# Patient Record
Sex: Female | Born: 1940 | Race: White | Hispanic: No | Marital: Single | Smoking: Former smoker
Health system: Southern US, Community
[De-identification: ages and names within clinical notes are randomized; demographics above are authoritative.]

## PROBLEM LIST (undated history)

## (undated) DIAGNOSIS — Z860100 Personal history of colon polyps, unspecified: Secondary | ICD-10-CM

## (undated) DIAGNOSIS — Z85828 Personal history of other malignant neoplasm of skin: Principal | ICD-10-CM

## (undated) DIAGNOSIS — Z8619 Personal history of other infectious and parasitic diseases: Secondary | ICD-10-CM

## (undated) DIAGNOSIS — C4492 Squamous cell carcinoma of skin, unspecified: Secondary | ICD-10-CM

## (undated) DIAGNOSIS — I6529 Occlusion and stenosis of unspecified carotid artery: Secondary | ICD-10-CM

## (undated) DIAGNOSIS — N189 Chronic kidney disease, unspecified: Secondary | ICD-10-CM

## (undated) DIAGNOSIS — E559 Vitamin D deficiency, unspecified: Secondary | ICD-10-CM

## (undated) DIAGNOSIS — H8110 Benign paroxysmal vertigo, unspecified ear: Secondary | ICD-10-CM

## (undated) DIAGNOSIS — R42 Dizziness and giddiness: Secondary | ICD-10-CM

## (undated) DIAGNOSIS — K219 Gastro-esophageal reflux disease without esophagitis: Secondary | ICD-10-CM

## (undated) DIAGNOSIS — Z8719 Personal history of other diseases of the digestive system: Secondary | ICD-10-CM

## (undated) DIAGNOSIS — K589 Irritable bowel syndrome without diarrhea: Secondary | ICD-10-CM

## (undated) DIAGNOSIS — R05 Cough: Secondary | ICD-10-CM

## (undated) DIAGNOSIS — L723 Sebaceous cyst: Secondary | ICD-10-CM

## (undated) DIAGNOSIS — E785 Hyperlipidemia, unspecified: Secondary | ICD-10-CM

## (undated) HISTORY — DX: Sebaceous cyst: L72.3

## (undated) HISTORY — DX: Personal history of colon polyps, unspecified: Z86.0100

## (undated) HISTORY — PX: COLONOSCOPY: SHX174

## (undated) HISTORY — DX: Squamous cell carcinoma of skin, unspecified: C44.92

## (undated) HISTORY — DX: Personal history of other malignant neoplasm of skin: Z85.828

## (undated) HISTORY — DX: Personal history of other infectious and parasitic diseases: Z86.19

## (undated) HISTORY — DX: Cough: R05

## (undated) HISTORY — PX: CYST EXCISION: SHX5701

## (undated) HISTORY — DX: Personal history of colonic polyps: Z86.010

## (undated) HISTORY — DX: Irritable bowel syndrome, unspecified: K58.9

## (undated) HISTORY — DX: Benign paroxysmal vertigo, unspecified ear: H81.10

---

## 1988-12-31 HISTORY — PX: OTHER SURGICAL HISTORY: SHX169

## 1998-11-02 HISTORY — PX: BREAST SURGERY: SHX581

## 2002-11-02 DIAGNOSIS — R053 Chronic cough: Secondary | ICD-10-CM

## 2002-11-02 HISTORY — DX: Chronic cough: R05.3

## 2012-03-24 LAB — HM DEXA SCAN

## 2013-02-06 DIAGNOSIS — E559 Vitamin D deficiency, unspecified: Secondary | ICD-10-CM | POA: Insufficient documentation

## 2013-02-06 DIAGNOSIS — Z800 Family history of malignant neoplasm of digestive organs: Secondary | ICD-10-CM | POA: Insufficient documentation

## 2013-02-07 DIAGNOSIS — R351 Nocturia: Secondary | ICD-10-CM | POA: Insufficient documentation

## 2013-02-07 DIAGNOSIS — K589 Irritable bowel syndrome without diarrhea: Secondary | ICD-10-CM | POA: Insufficient documentation

## 2013-02-07 DIAGNOSIS — D126 Benign neoplasm of colon, unspecified: Secondary | ICD-10-CM | POA: Insufficient documentation

## 2013-02-16 DIAGNOSIS — R7309 Other abnormal glucose: Secondary | ICD-10-CM | POA: Insufficient documentation

## 2014-02-13 ENCOUNTER — Ambulatory Visit: Payer: Self-pay | Admitting: Adult Health

## 2014-03-07 ENCOUNTER — Ambulatory Visit (INDEPENDENT_AMBULATORY_CARE_PROVIDER_SITE_OTHER): Payer: Medicare Other | Admitting: Adult Health

## 2014-03-07 ENCOUNTER — Encounter: Payer: Self-pay | Admitting: Adult Health

## 2014-03-07 VITALS — BP 128/72 | HR 78 | Temp 98.2°F | Resp 14 | Ht 63.0 in | Wt 125.0 lb

## 2014-03-07 DIAGNOSIS — M21611 Bunion of right foot: Secondary | ICD-10-CM

## 2014-03-07 DIAGNOSIS — M21619 Bunion of unspecified foot: Secondary | ICD-10-CM

## 2014-03-07 DIAGNOSIS — M810 Age-related osteoporosis without current pathological fracture: Secondary | ICD-10-CM

## 2014-03-07 DIAGNOSIS — Z8551 Personal history of malignant neoplasm of bladder: Secondary | ICD-10-CM | POA: Insufficient documentation

## 2014-03-07 MED ORDER — CLOTRIMAZOLE-BETAMETHASONE 1-0.05 % EX CREA: 1.0000 "application " | TOPICAL_CREAM | Freq: Two times a day (BID) | CUTANEOUS | Status: DC

## 2014-03-07 NOTE — Patient Instructions (Signed)
   Thank you for choosing [place] [facility] for your health care needs.  I am referring you to Dr. Jacqlyn Larsen - Urology  I am referring you to Dr. Vickki Muff for podiatry  I am also referring you to have a bone density scan.  We will call you with these appointments.  Please schedule your Medicare Wellness Exam.

## 2014-03-07 NOTE — Progress Notes (Signed)
Patient ID: Kathleen Fitzgerald, female   DOB: [birth year], 73 y.o.   MRN: [id number]    Subjective:    Patient ID: Kathleen Fitzgerald, female    DOB: 10-13-1941, 73 y.o.   MRN: [id number]  HPI  Pt is a very pleasant 73 y/o female who presents to clinic to establish care. She moved from [place] to [place] about 2 years ago. She lived in [place] ~ 1 year and recently moved to [place]. She has brought some of her medical records.  Last Medicare Wellness Exam - 02/06/13. She will schedule her physical at her earliest convenience.  Dexa Scan 2013 showing osteoporosis. On Fosamax. Will schedule to have follow up Dexa.  Mammogram - 01/2013 - Normal  Pt has hx of carcinoma of the bladder. She was followed by Urologist in [place]. She was diagnosed in 2004. She will need to establish with a urologist locally.  She has a painful bunion on the right great toe. She has had bunion surgery on this foot. She would like a referral to podiatry.    Past Medical History  Diagnosis Date  . Personal history of colonic polyps     Colonoscopy 04/12/13 - Repeat 5 years - polys  . History of chicken pox     childhood  . Sebaceous cyst 1975 and 2006    Scalp  . BPPV (benign paroxysmal positional vertigo)     2 episodes  . IBS (irritable bowel syndrome)   . Chronic cough 2004    Has been evaluated with no etiology     Past Surgical History  Procedure Laterality Date  . Breast surgery Left 2000    biopsy - benign  . Bunion repair Right 12/1988     Family History  Problem Relation Age of Onset  . Breast cancer Mother   . Hypertension Mother   . Colon cancer Father   . Alcohol abuse Father   . Stroke Father   . Breast cancer Sister   . COPD Brother      History   Social History  . Marital Status: Single    Spouse Name: N/A    Number of Children: 0  . Years of Education: 13   Occupational History  . Secretarial Work     Retired   Social History Main Topics  . Smoking status: Former  Smoker -- 2.00 packs/day for 33 years    Quit date: 11/03/1991  . Smokeless tobacco: Never Used  . Alcohol Use: Yes     Comment: wine - rare  . Drug Use: No  . Sexual Activity: Not on file   Other Topics Concern  . Not on file   Social History Narrative   Delsy grew up in [place]. She was living in [place] for 1 year. She recently moved to the [place] area. She lives alone. Never married. No children. She has two cats ([place]). She enjoys gardening.       Exercise - not at present   Caffeine - 1 cup of coffee daily   Diet - follows healthy diet - fruits, vegetables, lean protein, Almond milk, water     Review of Systems  Constitutional: Negative.   HENT: Negative.   Eyes: Negative.   Respiratory: Negative.   Cardiovascular: Negative.   Gastrointestinal: Negative.   Endocrine: Negative.   Genitourinary: Negative.  Hematuria: right bunion pain.  Musculoskeletal: Positive for arthralgias.  Skin: Negative.   Allergic/Immunologic: Negative.   Neurological: Negative.  Hematological: Negative.   Psychiatric/Behavioral: Negative.        Objective:  BP 128/72  Pulse 78  Temp(Src) 98.2 F (36.8 C) (Oral)  Resp 14  Ht 5\' 3"  (1.6 m)  Wt 125 lb (56.7 kg)  BMI 22.15 kg/m2  SpO2 97%   Physical Exam  Constitutional: She is oriented to person, place, and time. No distress.  HENT:  Head: Normocephalic and atraumatic.  Eyes: Conjunctivae and EOM are normal.  Neck: Normal range of motion. Neck supple.  Cardiovascular: Normal rate, regular rhythm, normal heart sounds and intact distal pulses.  Exam reveals no gallop and no friction rub.   No murmur heard. Pulmonary/Chest: Effort normal and breath sounds normal. No respiratory distress. She has no wheezes. She has no rales.  Musculoskeletal: Normal range of motion.  Right great toe bunion  Neurological: She is alert and oriented to person, place, and time. She has normal reflexes. Coordination normal.  Skin: Skin is  warm and dry.  Psychiatric: She has a normal mood and affect. Her behavior is normal. Judgment and thought content normal.       Assessment & Plan:   1. Bunion of right foot Painful bunion. Refer to Dr. Vickki Muff - Ambulatory referral to Podiatry  2. Osteoporosis Ordered Dexa scan. Pt on fosamax weekly. Continue to follow. - HM DEXA SCAN  3. H/O carcinoma of bladder Diagnosed in 2004 and followed by urologist in [place]. I am referring her to Dr. Jacqlyn Larsen for follow up. - Ambulatory referral to Urology

## 2014-03-07 NOTE — Progress Notes (Signed)
Pre visit review using our clinic review tool, if applicable. No additional management support is needed unless otherwise documented below in the visit note. 

## 2014-03-09 ENCOUNTER — Telehealth: Payer: Self-pay | Admitting: Adult Health

## 2014-03-09 NOTE — Telephone Encounter (Signed)
Pt checking status for Urology referral.

## 2014-03-09 NOTE — Telephone Encounter (Signed)
Do you have a urology reerral for Kathleen Fitzgerald?

## 2014-03-12 ENCOUNTER — Telehealth: Payer: Self-pay | Admitting: *Deleted

## 2014-03-12 NOTE — Telephone Encounter (Signed)
Pt called checking on referral

## 2014-03-12 NOTE — Telephone Encounter (Signed)
PA request form for the Clotrimazole was placed in raquel box

## 2014-03-16 ENCOUNTER — Encounter: Payer: Self-pay | Admitting: Adult Health

## 2014-03-16 ENCOUNTER — Telehealth: Payer: Self-pay | Admitting: Adult Health

## 2014-03-16 ENCOUNTER — Other Ambulatory Visit: Payer: Self-pay | Admitting: Adult Health

## 2014-03-16 DIAGNOSIS — Z13820 Encounter for screening for osteoporosis: Secondary | ICD-10-CM

## 2014-03-16 NOTE — Telephone Encounter (Signed)
Patient requesting a order for a bone density test. Please see Shannon's pervious noted related to this request.

## 2014-03-16 NOTE — Telephone Encounter (Signed)
Patient states that you were going to order her a bone density test. She is going back to see Dr. Henrene Pastor in [place] instead of Dr. Jacqlyn Larsen. She was told by there office he was moving in June and that it might be June/July before he could see her. She can do Monday afternoon and Tuesday, Thursday and Friday anytime she isn't available on Wednesday at all.

## 2014-03-16 NOTE — Telephone Encounter (Signed)
Dexa scan ordered. 

## 2014-03-16 NOTE — Telephone Encounter (Signed)
Patient state she only uses the Lotrisone cream once a day and not twice a day as noted in her chart. I have updated this in her chart per [facility].

## 2014-03-25 NOTE — Telephone Encounter (Signed)
Documented all information in referral: [name] went to Dr. Bjorn Loser office and they told her it would be in June/July. Patient has opted to go back to [facility] to see Dr.Kevin Herbert Spires on Mar 19, 2014 in the afternoon.Kathleen Fitzgerald,Kathleen Fitzgerald

## 2014-03-27 NOTE — Telephone Encounter (Signed)
Pt came into office to inform she has seen Dr. Lennette Bihari P. Henrene Pastor, [facility] [place], [place] in [place].  Pt completed record request; faxed to Mickel Baas at [phone].  Phone: [phone].

## 2014-03-29 ENCOUNTER — Ambulatory Visit (INDEPENDENT_AMBULATORY_CARE_PROVIDER_SITE_OTHER): Payer: Medicare Other

## 2014-03-29 ENCOUNTER — Encounter: Payer: Self-pay | Admitting: Podiatrist

## 2014-03-29 ENCOUNTER — Ambulatory Visit (INDEPENDENT_AMBULATORY_CARE_PROVIDER_SITE_OTHER): Payer: Medicare Other | Admitting: Podiatrist

## 2014-03-29 VITALS — BP 124/70 | HR 66 | Resp 16 | Ht 63.0 in | Wt 125.0 lb

## 2014-03-29 DIAGNOSIS — M21619 Bunion of unspecified foot: Secondary | ICD-10-CM

## 2014-03-29 NOTE — Progress Notes (Signed)
   Subjective:    Patient ID: Kathleen Fitzgerald, female    DOB: [birth year], 73 y.o.   MRN: [id number]  HPI Comments: i have bunions on both of my feet. The right one hurts and the left one does not. i had surgery on the rt bunion 25 yrs ago and they didn't fix it right. If i play tennis it will hurt and i will have to pad it. Kathleen Fitzgerald been having problems for the last 25 yrs. i will wrap the big toe on the right foot. It seems to be getting worse. i have a callus on my right foot.  Foot Pain Associated symptoms include coughing.      Review of Systems  HENT:       Sinus problems  Respiratory: Positive for cough.   Gastrointestinal: Positive for diarrhea.  All other systems reviewed and are negative.      Objective:   Physical Exam Patient is awake, alert, and oriented x 3.  In no acute distress.  Vascular status is intact with palpable pedal pulses at 2/4 DP and PT bilateral and capillary refill time within normal limits. Neurological sensation is also intact bilaterally via Semmes Weinstein monofilament at 5/5 sites. Light touch, vibratory sensation, Achilles tendon reflex is intact. Dermatological exam reveals skin color, turger and texture as normal. No open lesions present.  Musculature intact with dorsiflexion, plantarflexion, inversion, eversion.  Are large bunion prominence is noted right greater than left. Decrease in range of motion at the first metatarsophalangeal joint is present right in comparison to left. The right hallux also presses laterally on the second toe and causes discomfort in the first interspace. The left bunion subjectively does not cause much discomfort.  X-rays are taken and revealed a previous shave bunionectomy on the right however it does not appear that the alignment of the metatarsophalangeal joint was addressed. She still has a large bony prominence and some arthritic changes at the first metatarsophalangeal joint and especially the base of the proximal phalanx.  The left foot also shows a bunion however arthritic changes are less.      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 I did agree that a bunion surgery would be the best way to reduce the pain and restore function to the joint. We discussed the surgery as well as the postoperative course. Due to her activity level she would like to wait until next year to consider the surgery. She will call if she is interested in having the operation performed. We did discuss it's been performed at [facility] in [place] on outpatient basis. She was concerned about having someone to take her to the surgery being that it is not in [place].

## 2014-03-29 NOTE — Patient Instructions (Signed)
Bunionectomy A bunionectomy is surgery to remove a bunion. A bunion is an enlargement of the joint at the base of the big toe. It is made up of bone and soft tissue on the inside part of the joint. Over time, a painful lump appears on the inside of the joint. The big toe begins to point inward toward the second toe. New bone growth can occur and a bone spur may form. The pain eventually causes difficulty walking. A bunion usually results from inflammation caused by the irritation of poorly fitting shoes. It often begins later in life. A bunionectomy is performed when nonsurgical treatment no longer works. When surgery is needed, the extent of the procedure will depend on the degree of deformity of the foot. Your surgeon will discuss with you the different procedures and what will work best for you depending on your age and health. LET YOUR CAREGIVER KNOW ABOUT:   Previous problems with anesthetics or medicines used to numb the skin.  Allergies to dyes, iodine, foods, and/or latex.  Medicines taken including herbs, eye drops, prescription medicines (especially medicines used to "thin the blood"), aspirin and other over-the-counter medicines, and steroids (by mouth or as a cream).  History of bleeding or blood problems.  Possibility of pregnancy, if this applies.  History of blood clots in your legs and/or lungs .  Previous surgery.  Other important health problems. RISKS AND COMPLICATIONS   Infection.  Pain.  Nerve damage.  Possibility that the bunion will recur. BEFORE THE PROCEDURE  You should be present 60 minutes prior to your procedure or as directed.  PROCEDURE  Surgery is often done so that you can go home the same day (outpatient). It may be done in a hospital or in an outpatient surgical center. An anesthetic will be used to help you sleep during the procedure. Sometimes, a spinal anesthetic is used to make you numb below the waist. A cut (incision) is made over the swollen  area at the first joint of the big toe. The enlarged lump will be removed. If there is a need to reposition the bones of the big toe, this may require more than 1 incision. The bone itself may need to be cut. Screws and wires may be used in the repair. These can be removed at a later date. In severe cases, the entire joint may need to be removed and a joint replacement inserted. When done, the incision is closed with stitches (sutures). Skin adhesive strips may be added for reinforcement. They help hold the incision closed.  AFTER THE PROCEDURE  Compression bandages (dressings) are then wrapped around the wound. This helps to keep the foot in alignment and reduce swelling. Your foot will be monitored for bleeding and swelling. You will need to stay for a few hours in the recovery area before being discharged. This allows time for the anesthesia to wear off. You will be discharged home when you are awake, stable, and doing well. HOME CARE INSTRUCTIONS   You can expect to return to normal activities within 6 to 8 weeks after surgery. The foot is at increased risk for swelling for several months. When you can expect to bear weight on the operated foot will depend on the extent of your surgery. The milder the deformity, the less tissue is removed and the sooner the return to normal activity level. During the recovery period, a special shoe, boot, or cast may be worn to accommodate the surgical bandage and to help provide stability   to the foot.  Once you are home, an ice pack applied to the operative site may help with discomfort and keep swelling down. Stop using the ice if it causes discomfort.  Keep your feet raised (elevated) when possible to lessen swelling.  If you have an elastic bandage on your foot and you have numbness, tingling, or your foot becomes cold and blue, adjust the bandage to make it comfortable.  Change dressings as directed.  Keep the wound dry and clean. The wound may be washed  gently with soap and water. Gently blot dry without rubbing. Do not take baths or use swimming pools or hot tubs for 10 days, or as instructed by your caregiver.  Only take over-the-counter or prescription medicines for pain, discomfort, or fever as directed by your caregiver.  You may continue a normal diet as directed.  For activity, use crutches with no weight bearing or your orthopedic shoe as directed. Continue to use crutches or a cane as directed until you can stand without causing pain. SEEK MEDICAL CARE IF:   You have redness, swelling, bruising, or increasing pain in the wound.  There is pus coming from the wound.  You have drainage from a wound lasting longer than 1 day.  You have an oral temperature above 102 F (38.9 C).  You notice a bad smell coming from the wound or dressing.  The wound breaks open after sutures have been removed.  You develop dizzy episodes or fainting while standing.  You have persistent nausea or vomiting.  Your toes become cold.  Pain is not relieved with medicines. SEEK IMMEDIATE MEDICAL CARE IF:   You develop a rash.  You have difficulty breathing.  You develop any reaction or side effects to medicines given.  Your toes are numb or blue, or you have severe pain. MAKE SURE YOU:   Understand these instructions.  Will watch your condition.  Will get help right away if you are not doing well or get worse. Document Released: 10/02/2005 Document Revised: 01/11/2012 Document Reviewed: 11/07/2007 Exit[employer] Patient Information 2014 [employer].  

## 2014-03-30 ENCOUNTER — Ambulatory Visit (INDEPENDENT_AMBULATORY_CARE_PROVIDER_SITE_OTHER): Payer: Medicare Other | Admitting: Adult Health

## 2014-03-30 ENCOUNTER — Encounter: Payer: Self-pay | Admitting: Adult Health

## 2014-03-30 VITALS — BP 122/68 | HR 81 | Temp 98.0°F | Resp 14 | Ht 63.0 in | Wt 125.0 lb

## 2014-03-30 DIAGNOSIS — E785 Hyperlipidemia, unspecified: Secondary | ICD-10-CM

## 2014-03-30 DIAGNOSIS — Z0000 Encounter for general adult medical examination without abnormal findings: Secondary | ICD-10-CM

## 2014-03-30 DIAGNOSIS — Z1239 Encounter for other screening for malignant neoplasm of breast: Secondary | ICD-10-CM

## 2014-03-30 DIAGNOSIS — Z13820 Encounter for screening for osteoporosis: Secondary | ICD-10-CM

## 2014-03-30 NOTE — Progress Notes (Signed)
Patient ID: Kathleen Fitzgerald, female   DOB: [birth year], 73 y.o.   MRN: [id number]   Subjective:    Patient ID: Kathleen Fitzgerald, female    DOB: 1941-09-14, 73 y.o.   MRN: [id number]  HPI  The patient is here for annual Medicare wellness examination and management of other chronic and acute problems.   The risk factors are reflected in the social history.  The roster of all physicians providing medical care to patient is listed in the Snapshot section of the chart.  Activities of daily living:  The patient is 100% independent in all ADLs: dressing, toileting, bathing, feeding as well as independent mobility.  Instrumental Activities of daily living: The patient is 100% independent in all iADLs: cooking, driving, keeping track of finances, managing medications, shopping, using telephone and computer.  Home safety: The patient has smoke detectors in the home. Seatbelts are worn 100%.  There are no firearms at home. There is no violence in the home. No hx of IPV.  There is no risks for hepatitis, STDs or HIV. There is no history of blood transfusion. No travel history to infectious disease endemic areas of the world.  The patient has seen dentist in the last six month. Pt has seen eye doctor in the last year. No hearing impairment. Deferred audiologic testing in the last year.  No excessive sun exposure. Discussed the need for sun protection: hats, long sleeves and use of sunscreen if there is significant sun exposure.   Diet: the importance of a healthy diet is discussed. Pt follows a healthy diet.  The benefits of regular aerobic exercise were discussed. Currently not exercising.  Depression screen: there are no signs or vegative symptoms of depression- irritability, change in appetite, anhedonia, sadness/tearfullness.  Cognitive assessment: the patient manages all their financial and personal affairs and is actively engaged. Able to relate day,date,year and events; recalled 2/3 objects at 3  minutes; performed clock-face test normally.  The following portions of the patient's history were reviewed and updated as appropriate: allergies, current medications, past family history, past medical history,  past surgical history, past social history  and problem list.  Visual acuity was not assessed per patient preference since has regular follow up with ophthalmologist. Hearing and body mass index were assessed and reviewed.   During the course of the visit the patient was educated and counseled about appropriate screening and preventive services including : fall prevention , diabetes screening, nutrition counseling, colorectal cancer screening, and recommended immunizations.      Past Medical History  Diagnosis Date  . Personal history of colonic polyps     Colonoscopy 04/12/13 - Repeat 5 years - polys  . History of chicken pox     childhood  . Sebaceous cyst 1975 and 2006    Scalp  . BPPV (benign paroxysmal positional vertigo)     2 episodes  . IBS (irritable bowel syndrome)   . Chronic cough 2004    Has been evaluated with no etiology     Past Surgical History  Procedure Laterality Date  . Breast surgery Left 2000    biopsy - benign  . Bunion repair Right 12/1988     Family History  Problem Relation Age of Onset  . Breast cancer Mother   . Hypertension Mother   . Colon cancer Father   . Alcohol abuse Father   . Stroke Father   . Breast cancer Sister   . COPD Sister      History  Social History  . Marital Status: Single    Spouse Name: N/A    Number of Children: 0  . Years of Education: 13   Occupational History  . Secretarial Work     Retired   Social History Main Topics  . Smoking status: Former Smoker -- 2.00 packs/day for 33 years    Quit date: 11/03/1991  . Smokeless tobacco: Never Used  . Alcohol Use: Yes     Comment: wine - rare  . Drug Use: No  . Sexual Activity: Not on file   Other Topics Concern  . Not on file   Social History  Narrative   Kathleen Fitzgerald grew up in [place]. She was living in [place] for 1 year. She recently moved to the [place] area. She lives alone. Never married. No children. She has two cats ([place]). She enjoys gardening.       Exercise - not at present   Caffeine - 1 cup of coffee daily   Diet - follows healthy diet - fruits, vegetables, lean protein, Almond milk, water     Current Outpatient Prescriptions on File Prior to Visit  Medication Sig Dispense Refill  . alendronate (FOSAMAX) 35 MG tablet Take 35 mg by mouth every 7 (seven) days. Take with a full glass of water on an empty stomach.      . calcium-vitamin D (OSCAL WITH D) 500-200 MG-UNIT per tablet Take 1 tablet by mouth 2 (two) times daily.       . Cholecalciferol (VITAMIN D-3) 1000 UNITS CAPS Take 2,000 Units by mouth daily.      . Multiple Vitamin (MULTIVITAMIN) tablet Take 1 tablet by mouth daily.       No current facility-administered medications on file prior to visit.     Review of Systems  Constitutional: Negative.   HENT: Negative.   Eyes: Negative.   Respiratory: Negative.   Cardiovascular: Negative.   Gastrointestinal: Positive for diarrhea (occasional. Hx of IBS).  Endocrine: Negative.   Genitourinary: Negative.   Musculoskeletal: Negative.   Skin: Negative.   Allergic/Immunologic: Negative.   Neurological: Negative.   Hematological: Negative.   Psychiatric/Behavioral: Negative.        Objective:  BP 122/68  Pulse 81  Temp(Src) 98 F (36.7 C) (Oral)  Resp 14  Ht 5\' 3"  (1.6 m)  Wt 125 lb (56.7 kg)  BMI 22.15 kg/m2  SpO2 97%   Physical Exam  Constitutional: She is oriented to person, place, and time. She appears well-developed and well-nourished. No distress.  HENT:  Head: Normocephalic and atraumatic.  Right Ear: External ear normal.  Left Ear: External ear normal.  Nose: Nose normal.  Mouth/Throat: Oropharynx is clear and moist.  Eyes: Conjunctivae and EOM are normal. Pupils are equal, round,  and reactive to light.  Neck: Normal range of motion. Neck supple. No tracheal deviation present. No thyromegaly present.  Cardiovascular: Normal rate, regular rhythm, normal heart sounds and intact distal pulses.  Exam reveals no gallop and no friction rub.   No murmur heard. Pulmonary/Chest: Effort normal and breath sounds normal. No respiratory distress. She has no wheezes. She has no rales. Right breast exhibits no inverted nipple, no mass, no nipple discharge, no skin change and no tenderness. Left breast exhibits no inverted nipple, no mass, no nipple discharge, no skin change and no tenderness. Breasts are symmetrical.  Abdominal: Soft. Bowel sounds are normal. She exhibits no distension and no mass. There is no tenderness. There is no rebound and  no guarding.  Genitourinary:  Deferred  Musculoskeletal: Normal range of motion. She exhibits no edema and no tenderness.  Lymphadenopathy:    She has no cervical adenopathy.  Neurological: She is alert and oriented to person, place, and time. She has normal reflexes. No cranial nerve deficit. Coordination normal.  Skin: Skin is warm and dry.  Psychiatric: She has a normal mood and affect. Her behavior is normal. Judgment and thought content normal.      Assessment & Plan:   1. Routine general medical examination at a health care facility Annual comprehensive exam was done including breast exam. Pelvic exam deferred. All screenings have been addressed. Check labs   2. HLD (hyperlipidemia) Screening for HLD.   3. Screening for osteoporosis Due for bone density - DG Bone Density; Future  4. Screening for breast cancer Order provided. She will self schedule - MM DIGITAL SCREENING BILATERAL; Future

## 2014-03-30 NOTE — Patient Instructions (Signed)
  You received your Medicare Wellness Exam Today.  Please return for fasting labs early next week.  Schedule your mammogram  We will contact you with an appointment to have your bone density test done.  Return for 6 month follow up or sooner if necessary.

## 2014-03-30 NOTE — Progress Notes (Signed)
Pre visit review using our clinic review tool, if applicable. No additional management support is needed unless otherwise documented below in the visit note. 

## 2014-04-02 ENCOUNTER — Telehealth: Payer: Self-pay | Admitting: Adult Health

## 2014-04-02 NOTE — Telephone Encounter (Signed)
Wants to discuss her second pneumonia shot.

## 2014-04-03 ENCOUNTER — Other Ambulatory Visit (INDEPENDENT_AMBULATORY_CARE_PROVIDER_SITE_OTHER): Payer: Medicare Other

## 2014-04-03 ENCOUNTER — Telehealth: Payer: Self-pay | Admitting: *Deleted

## 2014-04-03 DIAGNOSIS — E785 Hyperlipidemia, unspecified: Secondary | ICD-10-CM

## 2014-04-03 LAB — LIPID PANEL
CHOLESTEROL: 214 mg/dL — AB (ref 0–200)
HDL: 56.3 mg/dL (ref >39.00)
LDL Cholesterol: 145 mg/dL — ABNORMAL HIGH (ref 0–99)
Total CHOL/HDL Ratio: 4
Triglycerides: 65 mg/dL (ref 0.0–149.0)
VLDL: 13 mg/dL (ref 0.0–40.0)

## 2014-04-03 LAB — COMPREHENSIVE METABOLIC PANEL
ALBUMIN: 4 g/dL (ref 3.5–5.2)
ALK PHOS: 59 U/L (ref 39–117)
ALT: 18 U/L (ref 0–35)
AST: 21 U/L (ref 0–37)
BUN: 17 mg/dL (ref 6–23)
CALCIUM: 9.4 mg/dL (ref 8.4–10.5)
CHLORIDE: 102 meq/L (ref 96–112)
CO2: 29 meq/L (ref 19–32)
Creatinine, Ser: 0.9 mg/dL (ref 0.4–1.2)
GFR: 64.18 mL/min (ref >60.00)
GLUCOSE: 100 mg/dL — AB (ref 70–99)
POTASSIUM: 4.2 meq/L (ref 3.5–5.1)
SODIUM: 138 meq/L (ref 135–145)
TOTAL PROTEIN: 6.8 g/dL (ref 6.0–8.3)
Total Bilirubin: 0.9 mg/dL (ref 0.2–1.2)

## 2014-04-03 LAB — CBC WITH DIFFERENTIAL/PLATELET
BASOS ABS: 0 10*3/uL (ref 0.0–0.1)
Basophils Relative: 0.3 % (ref 0.0–3.0)
EOS ABS: 0.1 10*3/uL (ref 0.0–0.7)
Eosinophils Relative: 1.1 % (ref 0.0–5.0)
HEMATOCRIT: 44.3 % (ref 36.0–46.0)
HEMOGLOBIN: 14.6 g/dL (ref 12.0–15.0)
Lymphocytes Relative: 23.7 % (ref 12.0–46.0)
Lymphs Abs: 1.7 10*3/uL (ref 0.7–4.0)
MCHC: 33.1 g/dL (ref 30.0–36.0)
MCV: 93.6 fl (ref 78.0–100.0)
MONO ABS: 0.6 10*3/uL (ref 0.1–1.0)
Monocytes Relative: 8.1 % (ref 3.0–12.0)
NEUTROS ABS: 4.7 10*3/uL (ref 1.4–7.7)
Neutrophils Relative %: 66.8 % (ref 43.0–77.0)
PLATELETS: 311 10*3/uL (ref 150.0–400.0)
RBC: 4.73 Mil/uL (ref 3.87–5.11)
RDW: 13.1 % (ref 11.5–15.5)
WBC: 7 10*3/uL (ref 4.0–10.5)

## 2014-04-03 NOTE — Telephone Encounter (Signed)
Returned patient's call. No answer. Voicemail left will callback number.

## 2014-04-03 NOTE — Telephone Encounter (Signed)
What labs and dx?  

## 2014-04-04 ENCOUNTER — Encounter: Payer: Self-pay | Admitting: *Deleted

## 2014-04-20 ENCOUNTER — Telehealth: Payer: Self-pay | Admitting: Adult Health

## 2014-04-20 NOTE — Telephone Encounter (Signed)
Pt came into office with letter for medical clearance.  States she needs this by next Tuesday, 6/23.  Fax to [phone].  Pt states she will also pick up if necessary.  Copy made, placed in Raquel's box.  Please call pt when complete.

## 2014-04-23 LAB — HM DEXA SCAN

## 2014-04-23 LAB — HM MAMMOGRAPHY: HM MAMMO: NEGATIVE

## 2014-04-23 NOTE — Telephone Encounter (Signed)
Paperwork signed and faxed. Patient notified

## 2014-04-24 ENCOUNTER — Encounter: Payer: Self-pay | Admitting: *Deleted

## 2014-04-25 ENCOUNTER — Encounter: Payer: Self-pay | Admitting: *Deleted

## 2014-04-29 ENCOUNTER — Telehealth: Payer: Self-pay | Admitting: Adult Health

## 2014-04-29 NOTE — Telephone Encounter (Signed)
Dexa scan (04/23/14) shows osteoporosis of the femoral neck (left femur) and osteopenia of spine. Please ask pt how long she has been taking fosamax? Please advise.

## 2014-04-30 NOTE — Telephone Encounter (Signed)
Left message, notifying of results and requested call back with Fosamax question.

## 2014-04-30 NOTE — Telephone Encounter (Signed)
Pt called back, will have to find her records and will call us back.

## 2014-07-02 NOTE — Telephone Encounter (Signed)
Patient had bone density and mammogram done on 04/23/14.

## 2014-09-12 DIAGNOSIS — R053 Chronic cough: Secondary | ICD-10-CM | POA: Insufficient documentation

## 2014-09-12 DIAGNOSIS — J309 Allergic rhinitis, unspecified: Secondary | ICD-10-CM | POA: Insufficient documentation

## 2014-10-30 ENCOUNTER — Ambulatory Visit: Payer: Self-pay | Admitting: Internal Medicine

## 2015-03-28 ENCOUNTER — Ambulatory Visit
Admission: RE | Admit: 2015-03-28 | Discharge: 2015-03-28 | Disposition: A | Discharge status: Home / Self Care | Payer: Medicare Other | Source: Ambulatory Visit | Attending: Urology | Admitting: Urology

## 2015-03-28 ENCOUNTER — Other Ambulatory Visit: Payer: Self-pay | Admitting: Urology

## 2015-03-28 DIAGNOSIS — Z8551 Personal history of malignant neoplasm of bladder: Secondary | ICD-10-CM

## 2015-03-28 DIAGNOSIS — Z87442 Personal history of urinary calculi: Secondary | ICD-10-CM | POA: Diagnosis present

## 2015-04-04 ENCOUNTER — Other Ambulatory Visit: Payer: PRIVATE HEALTH INSURANCE

## 2015-04-04 ENCOUNTER — Encounter: Payer: Self-pay | Admitting: *Deleted

## 2015-04-04 DIAGNOSIS — G8929 Other chronic pain: Secondary | ICD-10-CM | POA: Diagnosis not present

## 2015-04-04 DIAGNOSIS — M899 Disorder of bone, unspecified: Secondary | ICD-10-CM | POA: Diagnosis not present

## 2015-04-04 DIAGNOSIS — K219 Gastro-esophageal reflux disease without esophagitis: Secondary | ICD-10-CM | POA: Diagnosis not present

## 2015-04-04 DIAGNOSIS — M2011 Hallux valgus (acquired), right foot: Secondary | ICD-10-CM | POA: Diagnosis present

## 2015-04-04 DIAGNOSIS — Q662 Congenital metatarsus (primus) varus: Secondary | ICD-10-CM | POA: Diagnosis not present

## 2015-04-04 DIAGNOSIS — M19071 Primary osteoarthritis, right ankle and foot: Secondary | ICD-10-CM | POA: Diagnosis not present

## 2015-04-04 NOTE — Patient Instructions (Signed)
  Your procedure is scheduled on: 04-12-15 Report to [facility] To find out your arrival time please call [phone] between 1PM - 3PM on 04-11-15 (THURSDAY)  Remember: Instructions that are not followed completely may result in serious medical risk, up to and including death, or upon the discretion of your surgeon and anesthesiologist your surgery may need to be rescheduled.    _X___ 1. Do not eat food or drink liquids after midnight. No gum chewing or hard candies.     _X___ 2. No Alcohol for 24 hours before or after surgery.   ____ 3. Bring all medications with you on the day of surgery if instructed.    _X__ 4. Notify your doctor if there is any change in your medical condition     (cold, fever, infections).     Do not wear jewelry, make-up, hairpins, clips or nail polish.  Do not wear lotions, powders, or perfumes. You may wear deodorant.  Do not shave 48 hours prior to surgery. Men may shave face and neck.  Do not bring valuables to the hospital.    [facility] is not responsible for any belongings or valuables.               Contacts, dentures or bridgework may not be worn into surgery.  Leave your suitcase in the car. After surgery it may be brought to your room.  For patients admitted to the hospital, discharge time is determined by your treatment team.   Patients discharged the day of surgery will not be allowed to drive home.   Please read over the following fact sheets that you were given:     _X___ Take these medicines the morning of surgery with A SIP OF WATER:    1. PRILOSEC  2. TAKE AN EXTRA DOSE OF PRILOSEC Thursday NIGHT  3.   4.  5.  6.  ____ Fleet Enema (as directed)   _X___ Use CHG Soap as directed  ____ Use inhalers on the day of surgery  ____ Stop metformin 2 days prior to surgery    ____ Take 1/2 of usual insulin dose the night before surgery and none on the morning of surgery.   ____ Stop  Coumadin/Plavix/aspirin-N/A  ____ Stop Anti-inflammatories-NO NSAIDS OR ASPIRIN PRODUCTS-TYLENOL OK   ____ Stop supplements until after surgery.    ____ Bring C-Pap to the hospital.

## 2015-04-08 ENCOUNTER — Encounter
Admission: RE | Admit: 2015-04-08 | Discharge: 2015-04-08 | Disposition: A | Discharge status: Home / Self Care | Payer: Medicare Other | Source: Ambulatory Visit | Attending: Internal Medicine | Admitting: Internal Medicine

## 2015-04-08 DIAGNOSIS — M2011 Hallux valgus (acquired), right foot: Secondary | ICD-10-CM | POA: Insufficient documentation

## 2015-04-08 DIAGNOSIS — Z01812 Encounter for preprocedural laboratory examination: Secondary | ICD-10-CM | POA: Diagnosis present

## 2015-04-09 NOTE — OR Nursing (Signed)
PTS EKG SENT OVER TO ANESTHESIA FOR REVIEW-DR CARROLL WANTS PCP TO EVALUATE EKG FOR MI-CALLED DR SINGS OFFICE AND LET THEM KNOW OF THIS AND ALSO FAXED NOTE AND EKG.

## 2015-04-12 ENCOUNTER — Encounter
Admission: RE | Disposition: A | Discharge status: Home / Self Care | Payer: Self-pay | Source: Ambulatory Visit | Attending: Podiatry

## 2015-04-12 ENCOUNTER — Ambulatory Visit
Admission: RE | Admit: 2015-04-12 | Discharge: 2015-04-12 | Disposition: A | Discharge status: Home / Self Care | Payer: Medicare Other | Source: Ambulatory Visit | Attending: Podiatry | Admitting: Podiatry

## 2015-04-12 ENCOUNTER — Ambulatory Visit: Payer: Medicare Other | Admitting: *Deleted

## 2015-04-12 DIAGNOSIS — M19071 Primary osteoarthritis, right ankle and foot: Secondary | ICD-10-CM | POA: Insufficient documentation

## 2015-04-12 DIAGNOSIS — M899 Disorder of bone, unspecified: Secondary | ICD-10-CM | POA: Insufficient documentation

## 2015-04-12 DIAGNOSIS — K219 Gastro-esophageal reflux disease without esophagitis: Secondary | ICD-10-CM | POA: Insufficient documentation

## 2015-04-12 DIAGNOSIS — Q662 Congenital metatarsus (primus) varus: Secondary | ICD-10-CM | POA: Diagnosis not present

## 2015-04-12 DIAGNOSIS — G8929 Other chronic pain: Secondary | ICD-10-CM | POA: Insufficient documentation

## 2015-04-12 HISTORY — DX: Personal history of other diseases of the digestive system: Z87.19

## 2015-04-12 HISTORY — DX: Chronic kidney disease, unspecified: N18.9

## 2015-04-12 HISTORY — DX: Gastro-esophageal reflux disease without esophagitis: K21.9

## 2015-04-12 HISTORY — PX: HALLUX VALGUS AUSTIN: SHX6623

## 2015-04-12 SURGERY — CORRECTION, HALLUX VALGUS
Anesthesia: General | Laterality: Right | Wound class: Clean

## 2015-04-12 MED ORDER — ONDANSETRON HCL 4 MG/2ML IJ SOLN: 4.0000 mg | Freq: Once | INTRAMUSCULAR | Status: AC | PRN

## 2015-04-12 MED ORDER — PROPOFOL INFUSION 10 MG/ML OPTIME
INTRAVENOUS | Status: DC | PRN
  Administered 2015-04-12: 50 ug/kg/min via INTRAVENOUS

## 2015-04-12 MED ORDER — LIDOCAINE HCL (PF) 1 % IJ SOLN
INTRAMUSCULAR | Status: AC
  Filled 2015-04-12: qty 30

## 2015-04-12 MED ORDER — HYDROMORPHONE HCL 1 MG/ML IJ SOLN: 0.2500 mg | INTRAMUSCULAR | Status: DC | PRN

## 2015-04-12 MED ORDER — MIDAZOLAM HCL 2 MG/2ML IJ SOLN
INTRAMUSCULAR | Status: DC | PRN
  Administered 2015-04-12: 2 mg via INTRAVENOUS

## 2015-04-12 MED ORDER — CEFAZOLIN SODIUM-DEXTROSE 2-3 GM-% IV SOLR
2.0000 g | Freq: Once | INTRAVENOUS | Status: AC
  Administered 2015-04-12: 2 g via INTRAVENOUS

## 2015-04-12 MED ORDER — BUPIVACAINE HCL (PF) 0.5 % IJ SOLN
INTRAMUSCULAR | Status: AC
  Filled 2015-04-12: qty 30

## 2015-04-12 MED ORDER — LACTATED RINGERS IV SOLN
Freq: Once | INTRAVENOUS | Status: AC
  Administered 2015-04-12: 09:00:00 via INTRAVENOUS

## 2015-04-12 MED ORDER — LIDOCAINE HCL (PF) 1 % IJ SOLN
INTRAMUSCULAR | Status: AC
Start: 2015-04-12 — End: 2015-04-12
  Administered 2015-04-12: 0.25 mL
  Filled 2015-04-12: qty 2

## 2015-04-12 MED ORDER — 0.9 % SODIUM CHLORIDE (POUR BTL) OPTIME
TOPICAL | Status: DC | PRN
  Administered 2015-04-12: 100 mL

## 2015-04-12 MED ORDER — CEFAZOLIN SODIUM-DEXTROSE 2-3 GM-% IV SOLR
INTRAVENOUS | Status: AC
  Administered 2015-04-12: 2 g via INTRAVENOUS
  Filled 2015-04-12: qty 50

## 2015-04-12 MED ORDER — FENTANYL CITRATE (PF) 100 MCG/2ML IJ SOLN
INTRAMUSCULAR | Status: DC | PRN
  Administered 2015-04-12: 25 ug via INTRAVENOUS
  Administered 2015-04-12: 50 ug via INTRAVENOUS
  Administered 2015-04-12: 25 ug via INTRAVENOUS

## 2015-04-12 MED ORDER — BUPIVACAINE HCL 0.5 % IJ SOLN
INTRAMUSCULAR | Status: DC | PRN
  Administered 2015-04-12: 10 mL

## 2015-04-12 SURGICAL SUPPLY — 66 items
BAG COUNTER SPONGE EZ (MISCELLANEOUS) ×2 IMPLANT
BANDAGE CONFORM 2X5YD N/S (GAUZE/BANDAGES/DRESSINGS) ×3 IMPLANT
BANDAGE ELASTIC 4 CLIP NS LF (GAUZE/BANDAGES/DRESSINGS) ×3 IMPLANT
BANDAGE ELASTIC 4 CLIP ST LF (GAUZE/BANDAGES/DRESSINGS) ×3 IMPLANT
BANDAGE STRETCH 3X4.1 STRL (GAUZE/BANDAGES/DRESSINGS) ×3 IMPLANT
BENZOIN TINCTURE PRP APPL 2/3 (GAUZE/BANDAGES/DRESSINGS) ×3 IMPLANT
BIT DRILL CANN 3.0 (BIT) ×3 IMPLANT
BIT DRILL TWST CANN 2.2X1.87MM (DRILL) ×1 IMPLANT
BLADE OSC/SAGITTAL MD 5.5X18 (BLADE) ×3 IMPLANT
BLADE OSC/SAGITTAL MD 9X18.5 (BLADE) IMPLANT
BLADE SURG 15 STRL LF DISP TIS (BLADE) ×2 IMPLANT
BLADE SURG 15 STRL SS (BLADE) ×4
BLADE SURG MINI STRL (BLADE) ×3 IMPLANT
BNDG ESMARK 4X12 TAN STRL LF (GAUZE/BANDAGES/DRESSINGS) ×3 IMPLANT
BNDG GAUZE 4.5X4.1 6PLY STRL (MISCELLANEOUS) ×3 IMPLANT
BNDG STRETCH 4X75 STRL LF (GAUZE/BANDAGES/DRESSINGS) ×3 IMPLANT
CANISTER SUCT 1200ML W/VALVE (MISCELLANEOUS) ×3 IMPLANT
CLOSURE WOUND 1/4X4 (GAUZE/BANDAGES/DRESSINGS) ×1
COUNTER SPONGE BAG EZ (MISCELLANEOUS) ×1
COVER PIN YLW 0.028-062 (MISCELLANEOUS) IMPLANT
CUFF TOURN DUAL PL 12 NO SLV (MISCELLANEOUS) ×3 IMPLANT
DRAPE FLUOR MINI C-ARM 54X84 (DRAPES) ×3 IMPLANT
DRILL TWIST CANN 2.2X1.87MM (DRILL) ×3
DURAPREP 26ML APPLICATOR (WOUND CARE) ×3 IMPLANT
GAUZE PETRO XEROFOAM 1X8 (MISCELLANEOUS) ×3 IMPLANT
GAUZE SPONGE 4X4 12PLY STRL (GAUZE/BANDAGES/DRESSINGS) ×3 IMPLANT
GLOVE BIO SURGEON STRL SZ8 (GLOVE) ×6 IMPLANT
GLOVE INDICATOR 7.5 STRL GRN (GLOVE) ×6 IMPLANT
GOWN STRL REUS W/ TWL LRG LVL3 (GOWN DISPOSABLE) ×2 IMPLANT
GOWN STRL REUS W/TWL LRG LVL3 (GOWN DISPOSABLE) ×4
K WIRE ×9 IMPLANT
K-WIRE 1.1 (WIRE) ×2
K-WIRE FX100X1.1XTROC TIP (WIRE) ×1
K-WIRE TROCAR .8X100 (WIRE) ×6 IMPLANT
KIT RM TURNOVER STRD PROC AR (KITS) ×3 IMPLANT
KWIRE FX100X1.1XTROC TIP (WIRE) ×1 IMPLANT
LABEL OR SOLS (LABEL) ×3 IMPLANT
NDL SAFETY 25GX1.5 (NEEDLE) ×9 IMPLANT
NEEDLE FILTER BLUNT 18X 1/2SAF (NEEDLE) ×2
NEEDLE FILTER BLUNT 18X1 1/2 (NEEDLE) ×1 IMPLANT
NS IRRIG 500ML POUR BTL (IV SOLUTION) ×3 IMPLANT
PACK EXTREMITY ARMC (MISCELLANEOUS) ×3 IMPLANT
PAD CAST CTTN 4X4 STRL (SOFTGOODS) ×1 IMPLANT
PAD GROUND ADULT SPLIT (MISCELLANEOUS) ×3 IMPLANT
PADDING CAST 4IN STRL (MISCELLANEOUS) ×4
PADDING CAST BLEND 4X4 STRL (MISCELLANEOUS) ×2 IMPLANT
PADDING CAST COTTON 4X4 STRL (SOFTGOODS) ×2
PENCIL ELECTRO HAND CTR (MISCELLANEOUS) ×3 IMPLANT
RASP SM TEAR CROSS CUT (RASP) ×3 IMPLANT
SCREW ×2 IMPLANT
SCREW CAN 2.2X15 (Screw) ×6 IMPLANT
SCREW COMP ST 14MM (Screw) ×3 IMPLANT
SPLINT FAST PLASTER 5X30 (CAST SUPPLIES) ×2
SPLINT PLASTER CAST FAST 5X30 (CAST SUPPLIES) ×1 IMPLANT
STAPLE SPR MET 9X9X9 1.5 (Staple) ×3 IMPLANT
STOCKINETTE STRL 6IN 960660 (GAUZE/BANDAGES/DRESSINGS) ×3 IMPLANT
STRIP CLOSURE SKIN 1/4X4 (GAUZE/BANDAGES/DRESSINGS) ×2 IMPLANT
SUT ETHILON 5 0 P 3 18 (SUTURE) ×2
SUT NYLON ETHILON 5-0 P-3 1X18 (SUTURE) ×1 IMPLANT
SUT VIC AB 3-0 SH 27 (SUTURE) ×2
SUT VIC AB 3-0 SH 27X BRD (SUTURE) ×1 IMPLANT
SUT VIC AB 4-0 FS2 27 (SUTURE) ×3 IMPLANT
SYRINGE 10CC LL (SYRINGE) ×3 IMPLANT
Screw ×3 IMPLANT
WIRE Z .045 C-WIRE SPADE TIP (WIRE) IMPLANT
WIRE Z .062 C-WIRE SPADE TIP (WIRE) IMPLANT

## 2015-04-12 NOTE — Anesthesia Preprocedure Evaluation (Signed)
Anesthesia Evaluation  Patient identified by MRN, date of birth, ID band Patient awake    Reviewed: Allergy & Precautions, NPO status , Patient's Chart, lab work & pertinent test results  Airway Mallampati: II  TM Distance: >3 FB Neck ROM: Full    Dental  (+) Teeth Intact   Pulmonary former smoker,  Quit smoking in 1993. breath sounds clear to auscultation  Pulmonary exam normal       Cardiovascular Normal cardiovascular examRhythm:Regular Rate:Normal     Neuro/Psych    GI/Hepatic GERD-  Medicated and Controlled,  Endo/Other    Renal/GU      Musculoskeletal   Abdominal Normal abdominal exam  (+)   Peds  Hematology   Anesthesia Other Findings   Reproductive/Obstetrics                             Anesthesia Physical Anesthesia Plan  ASA: II  Anesthesia Plan: General   Post-op Pain Management:    Induction: Intravenous  Airway Management Planned: LMA  Additional Equipment:   Intra-op Plan:   Post-operative Plan: Extubation in OR  Informed Consent: I have reviewed the patients History and Physical, chart, labs and discussed the procedure including the risks, benefits and alternatives for the proposed anesthesia with the patient or authorized representative who has indicated his/her understanding and acceptance.     Plan Discussed with: CRNA  Anesthesia Plan Comments:         Anesthesia Quick Evaluation

## 2015-04-12 NOTE — H&P (Signed)
H&P reviewed and updated

## 2015-04-12 NOTE — Discharge Instructions (Signed)
Dispense PO instruction sheet.

## 2015-04-12 NOTE — OR Nursing (Signed)
29 sec  Xray time

## 2015-04-12 NOTE — Op Note (Signed)
Operative note.    Surgeon: Dr. Albertine Patricia, DPM.    Assistant: None    Preop diagnosis: 1. Recurrent hallux abductovalgus deformity right foot.                                   2. Exostosis second toe right foot   Postop diagnosis: Same    Procedure:   1. Hallux abductovalgus correction right foot with also an/Akin osteotomies   2. Excision of exostosis second toe PIPJ right foot       EBL: Negligible. Less than 10 cc    Anesthesia:general    Hemostasis: 250 mmHg pressure ankle tourniquet    Specimen: None    Complications: None    Operative indications: Chronic pain and recurrent structural deformity to the right great toe joint    Procedure:  Patient was brought into the OR and placed on the operating table in thesupine position. After anesthesia was obtained theright lower extremity was prepped and draped in usual sterile fashion.  Operative Report: This time attention directed to the first metatarsophalangeal joint the right foot where a 5 cm dorsolinear skin incision was made and deepened sharp dissection bleeders clamped and bovied as required. The Tissues and identified and incised longitudinally and reflected from the dorsal medial plantar aspects of the metatarsal head. A dorsal medial eminence of bone was noticed resected and rasped smoothly. This point a V osteotomy was performed of the long dorsal arm to facilitate screw placement and greater correction. The osteotomy was completed attention directed to the lateral aspect the joint were Nidek 10 release was performed. The fibular sesamoidal ligament release was also performed. Head of metatarsals and transposed to a more lateral position and fixated with a combination of 22.2 screws from the Med Artis screw set. The remaining medial shelf was then resected. There is checked FluoroScan good position and correction were noted. The ears and copiously irrigated. A medial capsulorrhaphy was then performed and closed with  3-0 Vicryl simple interrupted sutures. This point same the hallux was still an little bit of an abducted position so a phalanx osteotomy was performed in a V fashion with apex lateral base medial was done the proximal third of the shaft of the proximal phalanx. This area was also checked FluoroScan good position fixation was noted. After copious irrigation the remaining capsule and periosteal tissues were closed with combination of 30 simple sutures of 4, continuous stitch. Skin was closed with 4 Vicryl subcuticular stitch. Extensive fibrous tissue and old sutures were encountered from the previous surgery that she had.  At this time to his directed the second toe of the right foot where a 1 cm linear incision was made dorsomedially over the PIP joint. This was deepened with a Beaver blade down to bone which was utilized to free soft tissue away from the medial aspect of the PIPJ. Once this was down to bone and a power rasp was introduced and used to rasp the bony proliferation smoothly. Once this was complete the area was copiously irrigated and checked FluoroScan good reduction was noted. Following further irrigation the skin was enclosed with 5-0 nylon horizontal mattress stitch. A sterile compressive dressing was placed across the wounds consisting of Steri-Strips Xeroform gauze 4 x 4's Kling and Kerlix tourniquet released and prompt prevascular seen to return all digits of the right foot. A posterior splint was placed on the right foot and leg in  the operating room.    Patient tolerated the procedure and anesthesia well.  Was transported from the OR to the PACU with all vital signs stable and vascular status intact. To be discharged per routine protocol.  Will follow up in approximately 1 week in the outpatient clinic.

## 2015-04-12 NOTE — Anesthesia Postprocedure Evaluation (Signed)
  Anesthesia Post-op Note  Patient: Kathleen Fitzgerald  Procedure(s) Performed: Procedure(s) with comments: Right hallux valgus correction, aiken osteotomy  (Right) - excisions exostosis 2nd toe  Anesthesia type:General  Patient location: PACU  Post pain: Pain level controlled  Post assessment: Post-op Vital signs reviewed, Patient's Cardiovascular Status Stable, Respiratory Function Stable, Patent Airway and No signs of Nausea or vomiting  Post vital signs: Reviewed and stable  Last Vitals:  Filed Vitals:   04/12/15 1148  BP: 123/67  Pulse: 66  Temp:   Resp: 20    Level of consciousness: awake, alert  and patient cooperative  Complications: No apparent anesthesia complications

## 2015-04-12 NOTE — Transfer of Care (Signed)
Immediate Anesthesia Transfer of Care Note  Patient: Kathleen Fitzgerald  Procedure(s) Performed: Procedure(s): Right hallux valgus correction, aiken osteotomy  (Right)  Patient Location: PACU  Anesthesia Type:General  Level of Consciousness: awake and alert   Airway & Oxygen Therapy: Patient Spontanous Breathing  Post-op Assessment: Report given to RN  Post vital signs: stable  Last Vitals:  Filed Vitals:   04/12/15 1132  BP: 114/64  Pulse:   Temp: 36 C  Resp: 16    Complications: No apparent anesthesia complications

## 2015-04-15 ENCOUNTER — Encounter: Payer: Self-pay | Admitting: Podiatry

## 2015-04-16 ENCOUNTER — Encounter: Payer: Self-pay | Admitting: Podiatry

## 2015-04-17 ENCOUNTER — Encounter: Payer: Self-pay | Admitting: Podiatry

## 2017-03-25 ENCOUNTER — Ambulatory Visit (INDEPENDENT_AMBULATORY_CARE_PROVIDER_SITE_OTHER): Payer: Medicare Other | Admitting: Vascular Surgery

## 2017-03-25 ENCOUNTER — Encounter (INDEPENDENT_AMBULATORY_CARE_PROVIDER_SITE_OTHER): Payer: Self-pay | Admitting: Vascular Surgery

## 2017-03-25 ENCOUNTER — Other Ambulatory Visit (INDEPENDENT_AMBULATORY_CARE_PROVIDER_SITE_OTHER): Payer: Self-pay | Admitting: Vascular Surgery

## 2017-03-25 ENCOUNTER — Other Ambulatory Visit (INDEPENDENT_AMBULATORY_CARE_PROVIDER_SITE_OTHER): Payer: Medicare Other

## 2017-03-25 VITALS — BP 117/62 | HR 58 | Resp 16 | Ht 62.0 in | Wt 128.0 lb

## 2017-03-25 DIAGNOSIS — E785 Hyperlipidemia, unspecified: Secondary | ICD-10-CM

## 2017-03-25 DIAGNOSIS — M810 Age-related osteoporosis without current pathological fracture: Secondary | ICD-10-CM | POA: Diagnosis not present

## 2017-03-25 DIAGNOSIS — I739 Peripheral vascular disease, unspecified: Principal | ICD-10-CM

## 2017-03-25 DIAGNOSIS — I6529 Occlusion and stenosis of unspecified carotid artery: Secondary | ICD-10-CM | POA: Insufficient documentation

## 2017-03-25 DIAGNOSIS — I6523 Occlusion and stenosis of bilateral carotid arteries: Secondary | ICD-10-CM

## 2017-03-25 DIAGNOSIS — I779 Disorder of arteries and arterioles, unspecified: Secondary | ICD-10-CM | POA: Diagnosis not present

## 2017-03-25 NOTE — Progress Notes (Signed)
MRN : [id number]  Kathleen Fitzgerald is a 76 y.o. (12-26-1940) female who presents with chief complaint of  Chief Complaint  Patient presents with  . New Evaluation    Carotid evaluation  .  History of Present Illness: The patient is seen for evaluation of carotid stenosis. The carotid stenosis was identified at the vascular screening.  The patient denies amaurosis fugax. There is no recent history of TIA symptoms or focal motor deficits. There is no prior documented CVA.  There is no history of migraine headaches. There is no history of seizures.  The patient is taking enteric-coated aspirin 81 mg daily.  The patient has a history of coronary artery disease, no recent episodes of angina or shortness of breath. The patient denies PAD or claudication symptoms. There is a history of hyperlipidemia which is being treated with a statin.   Current Meds  Medication Sig  . calcium-vitamin D (OSCAL WITH D) 500-200 MG-UNIT per tablet Take 1 tablet by mouth 2 (two) times daily.   . Cholecalciferol (VITAMIN D-3) 1000 UNITS CAPS Take 2,000 Units by mouth daily.  . Fiber-Vitamins-Minerals (FIBERALL PO) Take by mouth.  . Multiple Vitamin (MULTIVITAMIN) tablet Take 1 tablet by mouth daily.    Past Medical History:  Diagnosis Date  . BPPV (benign paroxysmal positional vertigo)    2 episodes  . Chronic cough 2004   Has been evaluated with no etiology  . Chronic kidney disease    KIDNEY STONES  . GERD (gastroesophageal reflux disease)   . History of chicken pox    childhood  . History of hiatal hernia   . IBS (irritable bowel syndrome)   . Personal history of colonic polyps    Colonoscopy 04/12/13 - Repeat 5 years - polys  . Sebaceous cyst 1975 and 2006   Scalp    Past Surgical History:  Procedure Laterality Date  . BREAST SURGERY Left 2000   biopsy - benign  . bunion repair Right 12/1988  . CYST EXCISION     HEAD AND LEGS  . HALLUX VALGUS AUSTIN Right 04/12/2015   Procedure: Right  hallux valgus correction, aiken osteotomy ;  Surgeon: Albertine Patricia, DPM;  Location: [facility] ORS;  Service: Podiatry;  Laterality: Right;  excisions exostosis 2nd toe    Social History Social History  Substance Use Topics  . Smoking status: Former Smoker    Packs/day: 2.00    Years: 33.00    Quit date: 11/03/1991  . Smokeless tobacco: Never Used  . Alcohol use Yes     Comment: wine - rare    Family History Family History  Problem Relation Age of Onset  . Breast cancer Mother   . Hypertension Mother   . Colon cancer Father   . Alcohol abuse Father   . Stroke Father   . Breast cancer Sister   . COPD Sister   No family history of bleeding/clotting disorders, porphyria or autoimmune disease    Allergies  Allergen Reactions  . Donnatal [Pb-Hyoscy-Atropine-Scopolamine]   . Oxycodone     Other reaction(s): Hallucination Can take a low dose if needed     REVIEW OF SYSTEMS (Negative unless checked)  Constitutional: [] Weight loss  [] Fever  [] Chills Cardiac: [] Chest pain   [] Chest pressure   [] Palpitations   [] Shortness of breath when laying flat   [] Shortness of breath with exertion. Vascular:  [] Pain in legs with walking   [] Pain in legs at rest  [] History of DVT   [] Phlebitis   [] Swelling  in legs   [] Varicose veins   [] Non-healing ulcers Pulmonary:   [] Uses home oxygen   [] Productive cough   [] Hemoptysis   [] Wheeze  [] COPD   [] Asthma Neurologic:  [x] Dizziness   [] Seizures   [] History of stroke   [] History of TIA  [] Aphasia   [] Vissual changes   [] Weakness or numbness in arm   [] Weakness or numbness in leg Musculoskeletal:   [] Joint swelling   [x] Joint pain   [] Low back pain Hematologic:  [] Easy bruising  [] Easy bleeding   [] Hypercoagulable state   [] Anemic Gastrointestinal:  [] Diarrhea   [] Vomiting  [] Gastroesophageal reflux/heartburn   [] Difficulty swallowing. Genitourinary:  [] Chronic kidney disease   [] Difficult urination  [] Frequent urination   [] Blood in urine Skin:   [] Rashes   [] Ulcers  Psychological:  [] History of anxiety   []  History of major depression.  Physical Examination  Vitals:   03/25/17 1117 03/25/17 1118  BP: 107/62 117/62  Pulse: (!) 58   Resp: 16   Weight: 128 lb (58.1 kg)   Height: 5\' 2"  (1.575 m)    Body mass index is 23.41 kg/m. Gen: WD/WN, NAD Head: [place]/AT, No temporalis wasting.  Ear/Nose/Throat: Hearing grossly intact, nares w/o erythema or drainage, poor dentition Eyes: PER, EOMI, sclera nonicteric.  Neck: Supple, no masses.  No bruit or JVD.  Pulmonary:  Good air movement, clear to auscultation bilaterally, no use of accessory muscles.  Cardiac: RRR, normal S1, S2, no Murmurs. Vascular: no carotid bruits noted Vessel Right Left  Radial Palpable Palpable  PT Palpable Palpable  DP Palpable Palpable  Gastrointestinal: soft, non-distended. No guarding/no peritoneal signs.  Musculoskeletal: M/S 5/5 throughout.  No deformity or atrophy.  Neurologic: CN 2-12 intact. Pain and light touch intact in extremities.  Symmetrical.  Speech is fluent. Motor exam as listed above. Psychiatric: Judgment intact, Mood & affect appropriate for pt's clinical situation. Dermatologic: No rashes or ulcers noted.  No changes consistent with cellulitis. Lymph : No Cervical lymphadenopathy, no lichenification or skin changes of chronic lymphedema.  CBC Lab Results  Component Value Date   WBC 7.0 04/03/2014   HGB 14.6 04/03/2014   HCT 44.3 04/03/2014   MCV 93.6 04/03/2014   PLT 311.0 04/03/2014    BMET    Component Value Date/Time   NA 138 04/03/2014 0835   K 4.2 04/03/2014 0835   CL 102 04/03/2014 0835   CO2 29 04/03/2014 0835   GLUCOSE 100 (H) 04/03/2014 0835   BUN 17 04/03/2014 0835   CREATININE 0.9 04/03/2014 0835   CALCIUM 9.4 04/03/2014 0835   CrCl cannot be calculated (Patient's most recent lab result is older than the maximum 21 days allowed.).  COAG No results found for: INR, PROTIME  Radiology No results  found.  Assessment/Plan 1. Bilateral carotid artery stenosis Recommend:  Given the patient's asymptomatic subcritical stenosis no further invasive testing or surgery at this time.  Duplex ultrasound shows <30% stenosis bilaterally.  Continue antiplatelet therapy as prescribed she will start 81 mg EC ASA  Continue management of CAD, HTN and Hyperlipidemia Healthy heart diet,  encouraged exercise at least 4 times per week Follow up in 12 months with duplex ultrasound and physical exam based on <30% stenosis of the bilateral carotid artery   2. Age-related osteoporosis without current pathological fracture Continue Vit D and calcium supplementation  3. Hyperlipidemia, unspecified hyperlipidemia type Continue statin as ordered and reviewed, no changes at this time     Hortencia Pilar, MD  03/25/2017 11:40 AM

## 2017-04-15 ENCOUNTER — Other Ambulatory Visit (INDEPENDENT_AMBULATORY_CARE_PROVIDER_SITE_OTHER): Payer: Self-pay

## 2017-08-16 DIAGNOSIS — M8589 Other specified disorders of bone density and structure, multiple sites: Secondary | ICD-10-CM | POA: Insufficient documentation

## 2017-08-16 DIAGNOSIS — R7303 Prediabetes: Secondary | ICD-10-CM | POA: Insufficient documentation

## 2017-08-17 DIAGNOSIS — I6523 Occlusion and stenosis of bilateral carotid arteries: Secondary | ICD-10-CM | POA: Insufficient documentation

## 2018-03-31 ENCOUNTER — Encounter (INDEPENDENT_AMBULATORY_CARE_PROVIDER_SITE_OTHER): Payer: Self-pay | Admitting: Vascular Surgery

## 2018-03-31 ENCOUNTER — Ambulatory Visit (INDEPENDENT_AMBULATORY_CARE_PROVIDER_SITE_OTHER): Payer: Medicare Other | Admitting: Vascular Surgery

## 2018-03-31 ENCOUNTER — Ambulatory Visit (INDEPENDENT_AMBULATORY_CARE_PROVIDER_SITE_OTHER): Payer: Medicare Other

## 2018-03-31 VITALS — BP 113/61 | HR 62 | Resp 16 | Ht 62.5 in | Wt 125.0 lb

## 2018-03-31 DIAGNOSIS — I6523 Occlusion and stenosis of bilateral carotid arteries: Secondary | ICD-10-CM

## 2018-03-31 DIAGNOSIS — E785 Hyperlipidemia, unspecified: Secondary | ICD-10-CM

## 2018-03-31 NOTE — Progress Notes (Signed)
Subjective:    Patient ID: Kathleen Fitzgerald, female    DOB: [birth year], 77 y.o.   MRN: [id number] Chief Complaint  Patient presents with  . Carotid    1year follow up   Patient presents for a yearly non-invasive study follow up for carotid stenosis. The stenosis has been followed by surveillance duplexes. The patient underwent a bilateral carotid duplex scan which showed no change from the previous exam on 03/25/17. Duplex is stable at Right ICA without evidence of stenosis. Left ICA stenosis (<50%).  Bilateral view of vertebral arteries demonstrate antegrade flow.  Normal flow hemodynamics were seen in the by lateral subclavian arteries the patient denies experiencing Amaurosis Fugax, TIA like symptoms or focal motor deficits.  Patient denies any fever, nausea vomiting.  Review of Systems  Constitutional: Negative.   HENT: Negative.   Eyes: Negative.   Respiratory: Negative.   Cardiovascular: Negative.   Gastrointestinal: Negative.   Endocrine: Negative.   Genitourinary: Negative.   Musculoskeletal: Negative.   Skin: Negative.   Allergic/Immunologic: Negative.   Neurological: Negative.   Hematological: Negative.   Psychiatric/Behavioral: Negative.       Objective:   Physical Exam  Constitutional: She is oriented to person, place, and time. She appears well-developed and well-nourished. No distress.  HENT:  Head: Normocephalic and atraumatic.  Right Ear: External ear normal.  Left Ear: External ear normal.  Eyes: Pupils are equal, round, and reactive to light. Conjunctivae and EOM are normal.  Neck:  Carotid bruits noted on exam  Cardiovascular: Normal rate, regular rhythm, normal heart sounds and intact distal pulses.  Pulses:      Radial pulses are 2+ on the right side, and 2+ on the left side.  Pulmonary/Chest: Effort normal and breath sounds normal.  Musculoskeletal: Normal range of motion. She exhibits no edema.  Neurological: She is alert and oriented to person, place,  and time.  Skin: Skin is warm and dry. She is not diaphoretic.  Psychiatric: She has a normal mood and affect. Her behavior is normal. Judgment and thought content normal.  Vitals reviewed.  BP 113/61 (BP Location: Left Arm)   Pulse 62   Resp 16   Ht 5' 2.5" (1.588 m)   Wt 125 lb (56.7 kg)   BMI 22.50 kg/m   Past Medical History:  Diagnosis Date  . BPPV (benign paroxysmal positional vertigo)    2 episodes  . Chronic cough 2004   Has been evaluated with no etiology  . Chronic kidney disease    KIDNEY STONES  . GERD (gastroesophageal reflux disease)   . History of chicken pox    childhood  . History of hiatal hernia   . IBS (irritable bowel syndrome)   . Personal history of colonic polyps    Colonoscopy 04/12/13 - Repeat 5 years - polys  . Sebaceous cyst 1975 and 2006   Scalp   Social History   Socioeconomic History  . Marital status: Single    Spouse name: Not on file  . Number of children: 0  . Years of education: 52  . Highest education level: Not on file  Occupational History  . 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 Financial resource strain: Not on file  . Food insecurity:    Worry: Not on file    Inability: Not on file  . Transportation needs:    Medical: Not on file    Non-medical: Not on file  Tobacco Use  . Smoking  status: Former Smoker    Packs/day: 2.00    Years: 33.00    Pack years: 66.00    Last attempt to quit: 11/03/1991    Years since quitting: 26.4  . Smokeless tobacco: Never Used  Substance and Sexual Activity  . Alcohol use: Yes    Comment: wine - rare  . Drug use: No  . Sexual activity: Not Currently  Lifestyle  . Physical activity:    Days per week: Not on file    Minutes per session: Not on file  . Stress: Not on file  Relationships  . Social connections:    Talks on phone: Not on file    Gets together: Not on file    Attends religious service: Not on file    Active member of club or organization:  Not on file    Attends meetings of clubs or organizations: Not on file    Relationship status: Not on file  . Intimate partner violence:    Fear of current or ex partner: Not on file    Emotionally abused: Not on file    Physically abused: Not on file    Forced sexual activity: Not on file  Other Topics Concern  . Not on file  Social History Narrative   Kathleen Fitzgerald grew up in [place]. She was living in [place] for 1 year. She recently moved to the [place] area. She lives alone. Never married. No children. She has two cats ([place]). She enjoys gardening.       Exercise - not at present   Caffeine - 1 cup of coffee daily   Diet - follows healthy diet - fruits, vegetables, lean protein, Almond milk, water   Past Surgical History:  Procedure Laterality Date  . BREAST SURGERY Left 2000   biopsy - benign  . bunion repair Right 12/1988  . CYST EXCISION     HEAD AND LEGS  . HALLUX VALGUS AUSTIN Right 04/12/2015   Procedure: Right hallux valgus correction, aiken osteotomy ;  Surgeon: Albertine Patricia, DPM;  Location: [facility] ORS;  Service: Podiatry;  Laterality: Right;  excisions exostosis 2nd toe   Family History  Problem Relation Age of Onset  . Breast cancer Mother   . Hypertension Mother   . Colon cancer Father   . Alcohol abuse Father   . Stroke Father   . Breast cancer Sister   . COPD Sister    Allergies  Allergen Reactions  . Donnatal [Pb-Hyoscy-Atropine-Scopolamine]   . Oxycodone     Other reaction(s): Hallucination Can take a low dose if needed      Assessment & Plan:  Patient presents for a yearly non-invasive study follow up for carotid stenosis. The stenosis has been followed by surveillance duplexes. The patient underwent a bilateral carotid duplex scan which showed no change from the previous exam on 03/25/17. Duplex is stable at Right ICA without evidence of stenosis. Left ICA stenosis (<50%).  Bilateral view of vertebral arteries demonstrate antegrade flow.  Normal  flow hemodynamics were seen in the by lateral subclavian arteries the patient denies experiencing Amaurosis Fugax, TIA like symptoms or focal motor deficits.  Patient denies any fever, nausea vomiting.  1. Bilateral carotid artery stenosis - Stable Studies reviewed with patient. Patient asymptomatic with stable duplex. No intervention at this time. Patient to return in one year for surveillance carotid duplex. Patient to continue medical optimization with ASA  Patient to remain abstinent of tobacco use. I have discussed with the patient  at length the risk factors for and pathogenesis of atherosclerotic disease and encouraged a healthy diet, regular exercise regimen and blood pressure / glucose control.  Patient was instructed to contact our office in the interim with problems such as arm / leg weakness or numbness, speech / swallowing difficulty or temporary monocular blindness. The patient expresses their understanding.  2. Hyperlipidemia, unspecified hyperlipidemia type - Stable Encouraged good control as its slows the progression of atherosclerotic disease  Current Outpatient Medications on File Prior to Visit  Medication Sig Dispense Refill  . aspirin 81 MG chewable tablet Chew by mouth.    . Cholecalciferol (VITAMIN D-3) 1000 UNITS CAPS Take 2,000 Units by mouth daily.    . Inulin 2 g CHEW Chew by mouth.    . Multiple Vitamin (MULTIVITAMIN) tablet Take 1 tablet by mouth daily.    Marland Kitchen tiZANidine (ZANAFLEX) 2 MG tablet TAKE 1 TABLET (2 MG TOTAL) BY MOUTH NIGHTLY AS NEEDED CAUTION DROWSINESS  1   No current facility-administered medications on file prior to visit.    There are no Patient Instructions on file for this visit. No follow-ups on file.  Gusta Marksberry A Rebel Willcutt, PA-C

## 2018-04-20 DIAGNOSIS — M79672 Pain in left foot: Secondary | ICD-10-CM | POA: Insufficient documentation

## 2018-07-14 ENCOUNTER — Other Ambulatory Visit: Payer: Self-pay | Admitting: Unknown Physician Specialty

## 2018-07-14 DIAGNOSIS — K1123 Chronic sialoadenitis: Secondary | ICD-10-CM

## 2018-07-22 ENCOUNTER — Ambulatory Visit
Admission: RE | Admit: 2018-07-22 | Discharge: 2018-07-22 | Disposition: A | Discharge status: Home / Self Care | Payer: Medicare Other | Source: Ambulatory Visit | Attending: Unknown Physician Specialty | Admitting: Unknown Physician Specialty

## 2018-07-22 DIAGNOSIS — K1123 Chronic sialoadenitis: Secondary | ICD-10-CM | POA: Insufficient documentation

## 2018-07-22 DIAGNOSIS — K115 Sialolithiasis: Secondary | ICD-10-CM | POA: Insufficient documentation

## 2018-07-22 DIAGNOSIS — J479 Bronchiectasis, uncomplicated: Secondary | ICD-10-CM | POA: Insufficient documentation

## 2018-07-22 LAB — POCT I-STAT CREATININE: Creatinine, Ser: 0.7 mg/dL (ref 0.44–1.00)

## 2018-07-22 MED ORDER — IOPAMIDOL (ISOVUE-300) INJECTION 61%
75.0000 mL | Freq: Once | INTRAVENOUS | Status: AC | PRN
  Administered 2018-07-22: 75 mL via INTRAVENOUS

## 2018-08-29 DIAGNOSIS — R3129 Other microscopic hematuria: Secondary | ICD-10-CM | POA: Insufficient documentation

## 2018-08-29 DIAGNOSIS — K219 Gastro-esophageal reflux disease without esophagitis: Secondary | ICD-10-CM | POA: Insufficient documentation

## 2018-09-19 DIAGNOSIS — Z85828 Personal history of other malignant neoplasm of skin: Secondary | ICD-10-CM

## 2018-09-19 HISTORY — DX: Personal history of other malignant neoplasm of skin: Z85.828

## 2018-11-16 ENCOUNTER — Other Ambulatory Visit: Payer: Self-pay | Admitting: Nurse Practitioner

## 2018-11-16 DIAGNOSIS — R05 Cough: Secondary | ICD-10-CM

## 2018-11-16 DIAGNOSIS — R053 Chronic cough: Secondary | ICD-10-CM

## 2018-11-22 ENCOUNTER — Ambulatory Visit
Admission: RE | Admit: 2018-11-22 | Discharge: 2018-11-22 | Disposition: A | Discharge status: Home / Self Care | Payer: Medicare Other | Source: Ambulatory Visit | Attending: Nurse Practitioner | Admitting: Nurse Practitioner

## 2018-11-22 DIAGNOSIS — R05 Cough: Secondary | ICD-10-CM | POA: Insufficient documentation

## 2018-11-22 DIAGNOSIS — R053 Chronic cough: Secondary | ICD-10-CM

## 2019-02-15 DIAGNOSIS — Z85828 Personal history of other malignant neoplasm of skin: Secondary | ICD-10-CM

## 2019-03-17 ENCOUNTER — Ambulatory Visit: Admit: 2019-03-17 | Payer: Medicare Other | Admitting: Unknown Physician Specialty

## 2019-03-17 SURGERY — COLONOSCOPY WITH PROPOFOL
Anesthesia: General

## 2019-03-30 ENCOUNTER — Telehealth (INDEPENDENT_AMBULATORY_CARE_PROVIDER_SITE_OTHER): Payer: Self-pay

## 2019-03-30 NOTE — Telephone Encounter (Signed)
Patient called wanting to know if having a blocked salivary gland would interfere with having her ultrasound. I explained that it would not. I also gave the patient the day and time of her appt in our office. Patient is scheduled for carotid u/s and visit on 04/03/2019 starting at 2:00 pm and seeing the provider at 3:00 pm.

## 2019-04-03 ENCOUNTER — Ambulatory Visit (INDEPENDENT_AMBULATORY_CARE_PROVIDER_SITE_OTHER): Payer: Medicare Other | Admitting: Vascular Surgery

## 2019-04-03 ENCOUNTER — Ambulatory Visit (INDEPENDENT_AMBULATORY_CARE_PROVIDER_SITE_OTHER): Payer: Medicare Other

## 2019-04-03 ENCOUNTER — Other Ambulatory Visit: Payer: Self-pay

## 2019-04-03 ENCOUNTER — Other Ambulatory Visit (INDEPENDENT_AMBULATORY_CARE_PROVIDER_SITE_OTHER): Payer: Self-pay | Admitting: Vascular Surgery

## 2019-04-03 ENCOUNTER — Encounter (INDEPENDENT_AMBULATORY_CARE_PROVIDER_SITE_OTHER): Payer: Self-pay | Admitting: Vascular Surgery

## 2019-04-03 VITALS — BP 120/71 | HR 59 | Resp 18 | Ht 62.5 in | Wt 126.0 lb

## 2019-04-03 DIAGNOSIS — I6523 Occlusion and stenosis of bilateral carotid arteries: Secondary | ICD-10-CM | POA: Diagnosis not present

## 2019-04-03 DIAGNOSIS — Z7982 Long term (current) use of aspirin: Secondary | ICD-10-CM

## 2019-04-03 DIAGNOSIS — I739 Peripheral vascular disease, unspecified: Secondary | ICD-10-CM | POA: Diagnosis not present

## 2019-04-03 DIAGNOSIS — E785 Hyperlipidemia, unspecified: Secondary | ICD-10-CM

## 2019-04-03 DIAGNOSIS — I779 Disorder of arteries and arterioles, unspecified: Secondary | ICD-10-CM

## 2019-04-03 DIAGNOSIS — Z79899 Other long term (current) drug therapy: Secondary | ICD-10-CM

## 2019-04-03 DIAGNOSIS — Z7902 Long term (current) use of antithrombotics/antiplatelets: Secondary | ICD-10-CM

## 2019-04-03 DIAGNOSIS — Z87891 Personal history of nicotine dependence: Secondary | ICD-10-CM | POA: Diagnosis not present

## 2019-04-03 NOTE — Progress Notes (Signed)
MRN : [id number]  Kathleen Fitzgerald is a 78 y.o. ([birth year]) female who presents with chief complaint of  Chief Complaint  Patient presents with  . Follow-up    yearly carotid  .  History of Present Illness:   The patient is seen for evaluation of carotid stenosis. The carotid stenosis was identified at the vascular screening.  The patient denies amaurosis fugax. There is no recent history of TIA symptoms or focal motor deficits. There is no prior documented CVA.  There is no history of migraine headaches. There is no history of seizures.  The patient is taking enteric-coated aspirin 81 mg daily.  The patient has a history of coronary artery disease, no recent episodes of angina or shortness of breath. The patient denies PAD or claudication symptoms. There is a history of hyperlipidemia which is being treated with a statin.   Duplex is stable at Right ICA without evidence of stenosis. Left ICA stenosis (<50%).  Bilateral view of vertebral arteries demonstrate antegrade flow.  Normal flow hemodynamics were seen in the by lateral subclavian arteries  Current Meds  Medication Sig  . Cholecalciferol (VITAMIN D-3) 1000 UNITS CAPS Take 2,000 Units by mouth daily.  . Inulin 2 g CHEW Chew by mouth.  . Multiple Vitamin (MULTIVITAMIN) tablet Take 1 tablet by mouth daily.    Past Medical History:  Diagnosis Date  . BPPV (benign paroxysmal positional vertigo)    2 episodes  . Chronic cough 2004   Has been evaluated with no etiology  . Chronic kidney disease    KIDNEY STONES  . GERD (gastroesophageal reflux disease)   . History of chicken pox    childhood  . History of hiatal hernia   . 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 IBS (irritable bowel syndrome)   . Personal history of colonic polyps    Colonoscopy 04/12/13 - Repeat 5 years - polys  . Sebaceous cyst 1975 and 2006   Scalp    Past Surgical History:  Procedure Laterality Date  . BREAST  SURGERY Left 2000   biopsy - benign  . bunion repair Right 12/1988  . CYST EXCISION     HEAD AND LEGS  . HALLUX VALGUS AUSTIN Right 04/12/2015   Procedure: Right hallux valgus correction, aiken osteotomy ;  Surgeon: Albertine Patricia, DPM;  Location: [facility] ORS;  Service: Podiatry;  Laterality: Right;  excisions exostosis 2nd toe    Social History Social History   Tobacco Use  . Smoking status: Former Smoker    Packs/day: 2.00    Years: 33.00    Pack years: 66.00    Last attempt to quit: 11/03/1991    Years since quitting: 27.4  . Smokeless tobacco: Never Used  Substance Use Topics  . Alcohol use: Yes    Comment: wine - rare  . Drug use: No    Family History Family History  Problem Relation Age of Onset  . Breast cancer Mother   . Hypertension Mother   . Colon cancer Father   . Alcohol abuse Father   . Stroke Father   . Breast cancer Sister   . COPD Sister     Allergies  Allergen Reactions  . Donnatal [Pb-Hyoscy-Atropine-Scopolamine]   . Oxycodone     Other reaction(s): Hallucination Can take a low dose if needed     REVIEW OF SYSTEMS (Negative unless checked)  Constitutional: [] Weight loss  [] Fever  [] Chills Cardiac: [] Chest pain   []   Chest pressure   [] Palpitations   [] Shortness of breath when laying flat   [] Shortness of breath with exertion. Vascular:  [] Pain in legs with walking   [] Pain in legs at rest  [] History of DVT   [] Phlebitis   [] Swelling in legs   [] Varicose veins   [] Non-healing ulcers Pulmonary:   [] Uses home oxygen   [] Productive cough   [] Hemoptysis   [] Wheeze  [] COPD   [] Asthma Neurologic:  [] Dizziness   [] Seizures   [] History of stroke   [] History of TIA  [] Aphasia   [] Vissual changes   [] Weakness or numbness in arm   [] Weakness or numbness in leg Musculoskeletal:   [] Joint swelling   [] Joint pain   [] Low back pain Hematologic:  [] Easy bruising  [] Easy bleeding   [] Hypercoagulable state   [] Anemic Gastrointestinal:  [] Diarrhea   [] Vomiting   [] Gastroesophageal reflux/heartburn   [] Difficulty swallowing. Genitourinary:  [] Chronic kidney disease   [] Difficult urination  [] Frequent urination   [] Blood in urine Skin:  [] Rashes   [] Ulcers  Psychological:  [] History of anxiety   []  History of major depression.  Physical Examination  Vitals:   04/03/19 1448  BP: 120/71  Pulse: (!) 59  Resp: 18  Weight: 126 lb (57.2 kg)  Height: 5' 2.5" (1.588 m)   Body mass index is 22.68 kg/m. Gen: WD/WN, NAD Head: [place]/AT, No temporalis wasting.  Ear/Nose/Throat: Hearing grossly intact, nares w/o erythema or drainage Eyes: PER, EOMI, sclera nonicteric.  Neck: Supple, no large masses.   Pulmonary:  Good air movement, no audible wheezing bilaterally, no use of accessory muscles.  Cardiac: RRR, no JVD Vascular:  right carotid bruit Vessel Right Left  Radial Palpable Palpable  Brachial Palpable Palpable  Carotid Palpable Palpable  Gastrointestinal: Non-distended. No guarding/no peritoneal signs.  Musculoskeletal: M/S 5/5 throughout.  No deformity or atrophy.  Neurologic: CN 2-12 intact. Symmetrical.  Speech is fluent. Motor exam as listed above. Psychiatric: Judgment intact, Mood & affect appropriate for pt's clinical situation. Dermatologic: No rashes or ulcers noted.  No changes consistent with cellulitis. Lymph : No lichenification or skin changes of chronic lymphedema.  CBC Lab Results  Component Value Date   WBC 7.0 04/03/2014   HGB 14.6 04/03/2014   HCT 44.3 04/03/2014   MCV 93.6 04/03/2014   PLT 311.0 04/03/2014    BMET    Component Value Date/Time   NA 138 04/03/2014 0835   K 4.2 04/03/2014 0835   CL 102 04/03/2014 0835   CO2 29 04/03/2014 0835   GLUCOSE 100 (H) 04/03/2014 0835   BUN 17 04/03/2014 0835   CREATININE 0.70 07/22/2018 1130   CALCIUM 9.4 04/03/2014 0835   CrCl cannot be calculated (Patient's most recent lab result is older than the maximum 21 days allowed.).  COAG No results found for: INR, PROTIME   Radiology No results found.   Assessment/Plan 1. Bilateral carotid artery stenosis Recommend:  Given the patient's asymptomatic subcritical stenosis no further invasive testing or surgery at this time.  Duplex ultrasound shows <50% stenosis bilaterally.  Continue antiplatelet therapy as prescribed Continue management of CAD, HTN and Hyperlipidemia Healthy heart diet,  encouraged exercise at least 4 times per week Follow up in 18 months with duplex ultrasound and physical exam  - VAS US CAROTID; Future  2. Hyperlipidemia, unspecified hyperlipidemia type Continue statin as ordered and reviewed, no changes at this time     Hortencia Pilar, MD  04/03/2019 3:05 PM

## 2019-04-20 ENCOUNTER — Other Ambulatory Visit: Payer: Self-pay

## 2019-04-20 ENCOUNTER — Other Ambulatory Visit
Admission: RE | Admit: 2019-04-20 | Discharge: 2019-04-20 | Disposition: A | Discharge status: Home / Self Care | Payer: Medicare Other | Source: Ambulatory Visit | Attending: Unknown Physician Specialty | Admitting: Unknown Physician Specialty

## 2019-04-20 DIAGNOSIS — Z1159 Encounter for screening for other viral diseases: Secondary | ICD-10-CM | POA: Insufficient documentation

## 2019-04-21 LAB — NOVEL CORONAVIRUS, NAA (HOSP ORDER, SEND-OUT TO REF LAB; TAT 18-24 HRS): SARS-CoV-2, NAA: NOT DETECTED

## 2019-04-22 ENCOUNTER — Encounter: Payer: Self-pay | Admitting: Anesthesiology

## 2019-04-24 ENCOUNTER — Ambulatory Visit: Payer: Medicare Other | Admitting: Anesthesiology

## 2019-04-24 ENCOUNTER — Encounter
Admission: RE | Disposition: A | Discharge status: Home / Self Care | Payer: Self-pay | Source: Home / Self Care | Attending: Unknown Physician Specialty

## 2019-04-24 ENCOUNTER — Other Ambulatory Visit: Payer: Self-pay

## 2019-04-24 ENCOUNTER — Ambulatory Visit
Admission: RE | Admit: 2019-04-24 | Discharge: 2019-04-24 | Disposition: A | Discharge status: Home / Self Care | Payer: Medicare Other | Attending: Unknown Physician Specialty | Admitting: Unknown Physician Specialty

## 2019-04-24 ENCOUNTER — Encounter: Payer: Self-pay | Admitting: Certified Registered Nurse Anesthetist

## 2019-04-24 DIAGNOSIS — Z8249 Family history of ischemic heart disease and other diseases of the circulatory system: Secondary | ICD-10-CM | POA: Diagnosis not present

## 2019-04-24 DIAGNOSIS — K2970 Gastritis, unspecified, without bleeding: Secondary | ICD-10-CM | POA: Diagnosis not present

## 2019-04-24 DIAGNOSIS — Z87442 Personal history of urinary calculi: Secondary | ICD-10-CM | POA: Diagnosis not present

## 2019-04-24 DIAGNOSIS — K319 Disease of stomach and duodenum, unspecified: Secondary | ICD-10-CM | POA: Diagnosis not present

## 2019-04-24 DIAGNOSIS — K640 First degree hemorrhoids: Secondary | ICD-10-CM | POA: Diagnosis not present

## 2019-04-24 DIAGNOSIS — Z1211 Encounter for screening for malignant neoplasm of colon: Secondary | ICD-10-CM | POA: Diagnosis present

## 2019-04-24 DIAGNOSIS — R05 Cough: Secondary | ICD-10-CM | POA: Diagnosis not present

## 2019-04-24 DIAGNOSIS — Z823 Family history of stroke: Secondary | ICD-10-CM | POA: Insufficient documentation

## 2019-04-24 DIAGNOSIS — E559 Vitamin D deficiency, unspecified: Secondary | ICD-10-CM | POA: Diagnosis not present

## 2019-04-24 DIAGNOSIS — Z885 Allergy status to narcotic agent status: Secondary | ICD-10-CM | POA: Diagnosis not present

## 2019-04-24 DIAGNOSIS — K621 Rectal polyp: Secondary | ICD-10-CM | POA: Diagnosis not present

## 2019-04-24 DIAGNOSIS — E785 Hyperlipidemia, unspecified: Secondary | ICD-10-CM | POA: Diagnosis not present

## 2019-04-24 DIAGNOSIS — I6529 Occlusion and stenosis of unspecified carotid artery: Secondary | ICD-10-CM | POA: Insufficient documentation

## 2019-04-24 DIAGNOSIS — Z86010 Personal history of colonic polyps: Secondary | ICD-10-CM | POA: Insufficient documentation

## 2019-04-24 DIAGNOSIS — Z87891 Personal history of nicotine dependence: Secondary | ICD-10-CM | POA: Insufficient documentation

## 2019-04-24 DIAGNOSIS — K589 Irritable bowel syndrome without diarrhea: Secondary | ICD-10-CM | POA: Diagnosis not present

## 2019-04-24 DIAGNOSIS — R1310 Dysphagia, unspecified: Secondary | ICD-10-CM | POA: Diagnosis not present

## 2019-04-24 DIAGNOSIS — K219 Gastro-esophageal reflux disease without esophagitis: Secondary | ICD-10-CM | POA: Insufficient documentation

## 2019-04-24 DIAGNOSIS — Z7982 Long term (current) use of aspirin: Secondary | ICD-10-CM | POA: Insufficient documentation

## 2019-04-24 DIAGNOSIS — Z825 Family history of asthma and other chronic lower respiratory diseases: Secondary | ICD-10-CM | POA: Insufficient documentation

## 2019-04-24 DIAGNOSIS — K449 Diaphragmatic hernia without obstruction or gangrene: Secondary | ICD-10-CM | POA: Diagnosis not present

## 2019-04-24 DIAGNOSIS — Z811 Family history of alcohol abuse and dependence: Secondary | ICD-10-CM | POA: Insufficient documentation

## 2019-04-24 DIAGNOSIS — Z800 Family history of malignant neoplasm of digestive organs: Secondary | ICD-10-CM | POA: Diagnosis not present

## 2019-04-24 DIAGNOSIS — Z888 Allergy status to other drugs, medicaments and biological substances status: Secondary | ICD-10-CM | POA: Diagnosis not present

## 2019-04-24 DIAGNOSIS — Z803 Family history of malignant neoplasm of breast: Secondary | ICD-10-CM | POA: Insufficient documentation

## 2019-04-24 DIAGNOSIS — Z79899 Other long term (current) drug therapy: Secondary | ICD-10-CM | POA: Insufficient documentation

## 2019-04-24 HISTORY — DX: Hyperlipidemia, unspecified: E78.5

## 2019-04-24 HISTORY — DX: Dizziness and giddiness: R42

## 2019-04-24 HISTORY — PX: COLONOSCOPY WITH PROPOFOL: SHX5780

## 2019-04-24 HISTORY — DX: Occlusion and stenosis of unspecified carotid artery: I65.29

## 2019-04-24 HISTORY — PX: ESOPHAGOGASTRODUODENOSCOPY (EGD) WITH PROPOFOL: SHX5813

## 2019-04-24 HISTORY — DX: Vitamin D deficiency, unspecified: E55.9

## 2019-04-24 SURGERY — COLONOSCOPY WITH PROPOFOL
Anesthesia: General

## 2019-04-24 MED ORDER — LIDOCAINE HCL (PF) 2 % IJ SOLN
INTRAMUSCULAR | Status: AC
  Filled 2019-04-24: qty 10

## 2019-04-24 MED ORDER — PROPOFOL 500 MG/50ML IV EMUL
INTRAVENOUS | Status: DC | PRN
  Administered 2019-04-24: 140 ug/kg/min via INTRAVENOUS

## 2019-04-24 MED ORDER — PROPOFOL 500 MG/50ML IV EMUL
INTRAVENOUS | Status: AC
  Filled 2019-04-24: qty 50

## 2019-04-24 MED ORDER — LIDOCAINE HCL (CARDIAC) PF 100 MG/5ML IV SOSY
PREFILLED_SYRINGE | INTRAVENOUS | Status: DC | PRN
  Administered 2019-04-24: 50 mg via INTRAVENOUS

## 2019-04-24 MED ORDER — SODIUM CHLORIDE 0.9 % IV SOLN
INTRAVENOUS | Status: DC
  Administered 2019-04-24: 1000 mL via INTRAVENOUS

## 2019-04-24 MED ORDER — SODIUM CHLORIDE 0.9 % IV SOLN: INTRAVENOUS | Status: DC

## 2019-04-24 MED ORDER — PROPOFOL 10 MG/ML IV BOLUS
INTRAVENOUS | Status: DC | PRN
  Administered 2019-04-24: 80 mg via INTRAVENOUS

## 2019-04-24 NOTE — Op Note (Signed)
[facility] Gastroenterology Patient Name: Kathleen Fitzgerald Procedure Date: 04/24/2019 6:58 AM MRN: [id number] Account #: [id number] Date of Birth: 02/24/1941 Admit Type: Outpatient Age: 78 Room: [facility] ENDO ROOM 1 Gender: Female Note Status: Finalized Procedure:            Upper GI endoscopy Indications:          Dysphagia, Chronic cough Providers:            Manya Silvas, MD Medicines:            Propofol per Anesthesia Complications:        No immediate complications. Procedure:            Pre-Anesthesia Assessment:                       - After reviewing the risks and benefits, the patient                        was deemed in satisfactory condition to undergo the                        procedure.                       After obtaining informed consent, the endoscope was                        passed under direct vision. Throughout the procedure,                        the patient's blood pressure, pulse, and oxygen                        saturations were monitored continuously. The Endoscope                        was introduced through the mouth, and advanced to the                        second part of duodenum. The upper GI endoscopy was                        accomplished without difficulty. The patient tolerated                        the procedure well. Findings:      The examined esophagus was normal. GEJ 38cm.      A small-medium-sized hiatal hernia was present.      Striped mildly erythematous mucosa without bleeding was found in the       gastric antrum. Biopsies were taken with a cold forceps for histology.       Biopsies were taken with a cold forceps for Helicobacter pylori testing.       Body of stomach was normal.      The examined duodenum was normal. Impression:           - Normal esophagus.                       - Medium-sized hiatal hernia.                       -  Erythematous mucosa in the antrum. Biopsied.                       - Normal  examined duodenum. Recommendation:       - Await pathology results.                       - Perform a colonoscopy today. Manya Silvas, MD 04/24/2019 7:42:00 AM This report has been signed electronically. Number of Addenda: 0 Note Initiated On: 04/24/2019 6:58 AM      [facility]

## 2019-04-24 NOTE — Anesthesia Post-op Follow-up Note (Signed)
Anesthesia QCDR form completed.        

## 2019-04-24 NOTE — H&P (Signed)
Primary Care Physician:  Kirk Ruths, MD Primary Gastroenterologist:  Dr. Vira Agar  Pre-Procedure History & Physical: HPI:  Kathleen Fitzgerald is a 78 y.o. female is here for an endoscopy and colonoscopy.  Done for family history of colon cancer and dysphagia and chronic cough.   Past Medical History:  Diagnosis Date  . BPPV (benign paroxysmal positional vertigo)    2 episodes  . Carotid stenosis   . Chronic cough 2004   Has been evaluated with no etiology  . Chronic kidney disease    KIDNEY STONES  . GERD (gastroesophageal reflux disease)   . History of chicken pox    childhood  . History of hiatal hernia   . 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 Hyperlipidemia   . IBS (irritable bowel syndrome)   . Personal history of colonic polyps    Colonoscopy 04/12/13 - Repeat 5 years - polys  . Sebaceous cyst 1975 and 2006   Scalp  . Vertigo   . Vitamin D deficiency     Past Surgical History:  Procedure Laterality Date  . BREAST SURGERY Left 2000   biopsy - benign  . bunion repair Right 12/1988  . COLONOSCOPY    . CYST EXCISION     HEAD AND LEGS  . HALLUX VALGUS AUSTIN Right 04/12/2015   Procedure: Right hallux valgus correction, aiken osteotomy ;  Surgeon: Albertine Patricia, DPM;  Location: [facility] ORS;  Service: Podiatry;  Laterality: Right;  excisions exostosis 2nd toe    Prior to Admission medications   Medication Sig Start Date End Date Taking? Authorizing Provider  aspirin 81 MG chewable tablet Chew by mouth.   Yes [provider]  Cholecalciferol (VITAMIN D-3) 1000 UNITS CAPS Take 2,000 Units by mouth daily.   Yes [provider]  Inulin 2 g CHEW Chew by mouth.   Yes [provider]  Multiple Vitamin (MULTIVITAMIN) tablet Take 1 tablet by mouth daily.   Yes [provider]  pantoprazole (PROTONIX) 40 MG tablet Take 40 mg by mouth daily.   Yes [provider]  tiZANidine (ZANAFLEX) 2 MG tablet TAKE  1 TABLET (2 MG TOTAL) BY MOUTH NIGHTLY AS NEEDED CAUTION DROWSINESS 02/28/18  Yes [provider]  amoxicillin-clavulanate (AUGMENTIN) 500-125 MG tablet TAKE 1 TABLET BY MOUTH TWICE A DAY FOR 7 DAYS 03/29/19   [provider]    Allergies as of 04/17/2019 - Review Complete 04/03/2019  Allergen Reaction Noted  . Donnatal [pb-hyoscy-atropine-scopolamine]  04/11/2015  . Oxycodone  05/21/2015    Family History  Problem Relation Age of Onset  . Breast cancer Mother   . Hypertension Mother   . Colon cancer Father   . Alcohol abuse Father   . Stroke Father   . Breast cancer Sister   . COPD Sister     Social History   Socioeconomic History  . Marital status: Single    Spouse name: Not on file  . Number of children: 0  . Years of education: 3  . Highest education level: Not on file  Occupational History  . 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 Financial resource strain: Not on file  . Food insecurity    Worry: Not on file    Inability: Not on file  . Transportation needs    Medical: Not on file    Non-medical: Not on file  Tobacco Use  . Smoking status: Former  Smoker    Packs/day: 2.00    Years: 33.00    Pack years: 15.00    Quit date: 11/03/1991    Years since quitting: 27.4  . Smokeless tobacco: Never Used  Substance and Sexual Activity  . Alcohol use: Yes    Comment: wine - rare  . Drug use: No  . Sexual activity: Not Currently  Lifestyle  . Physical activity    Days per week: Not on file    Minutes per session: Not on file  . Stress: Not on file  Relationships  . Social Herbalist on phone: Not on file    Gets together: Not on file    Attends religious service: Not on file    Active member of club or organization: Not on file    Attends meetings of clubs or organizations: Not on file    Relationship status: Not on file  . Intimate partner violence    Fear of current or ex partner: Not on file     Emotionally abused: Not on file    Physically abused: Not on file    Forced sexual activity: Not on file  Other Topics Concern  . Not on file  Social History Narrative   Anetha grew up in [place]. She was living in [place] for 1 year. She recently moved to the [place] area. She lives alone. Never married. No children. She has two cats ([place]). She enjoys gardening.       Exercise - not at present   Caffeine - 1 cup of coffee daily   Diet - follows healthy diet - fruits, vegetables, lean protein, Almond milk, water    Review of Systems: See HPI, otherwise negative ROS  Physical Exam: BP (!) 144/121   Pulse 71   Temp (!) 96.6 F (35.9 C) (Tympanic)   Resp 20   Ht 5\' 2"  (1.575 m)   Wt 57.2 kg   SpO2 100%   BMI 23.05 kg/m  General:   Alert,  pleasant and cooperative in NAD Head:  Normocephalic and atraumatic. Neck:  Supple; no masses or thyromegaly. Lungs:  Clear throughout to auscultation.    Heart:  Regular rate and rhythm. Abdomen:  Soft, nontender and nondistended. Normal bowel sounds, without guarding, and without rebound.   Neurologic:  Alert and  oriented x4;  grossly normal neurologically.  Impression/Plan: Kathleen Fitzgerald is here for an endoscopy and colonoscopy to be performed for dysphagia and FH colon cancer.  Risks, benefits, limitations, and alternatives regarding  endoscopy and colonoscopy have been reviewed with the patient.  Questions have been answered.  All parties agreeable.   Gaylyn Cheers, MD  04/24/2019, 7:24 AM

## 2019-04-24 NOTE — Transfer of Care (Signed)
Immediate Anesthesia Transfer of Care Note  Patient: Kathleen Fitzgerald  Procedure(s) Performed: COLONOSCOPY WITH PROPOFOL (N/A ) ESOPHAGOGASTRODUODENOSCOPY (EGD) WITH PROPOFOL (N/A )  Patient Location: PACU and Endoscopy Unit  Anesthesia Type:General  Level of Consciousness: drowsy  Airway & Oxygen Therapy: Patient Spontanous Breathing  Post-op Assessment: Report given to RN and Post -op Vital signs reviewed and stable  Post vital signs: Reviewed and stable  Last Vitals:  Vitals Value Taken Time  BP 94/53 04/24/19 0810  Temp    Pulse 64 04/24/19 0809  Resp 17 04/24/19 0809  SpO2 97 % 04/24/19 0809  Vitals shown include unvalidated device data.  Last Pain:  Vitals:   04/24/19 0657  TempSrc: Tympanic         Complications: No apparent anesthesia complications

## 2019-04-24 NOTE — Anesthesia Preprocedure Evaluation (Signed)
Anesthesia Evaluation  Patient identified by MRN, date of birth, ID band Patient awake    Reviewed: Allergy & Precautions, NPO status , Patient's Chart, lab work & pertinent test results, reviewed documented beta blocker date and time   Airway Mallampati: II  TM Distance: >3 FB     Dental  (+) Chipped   Pulmonary former smoker,           Cardiovascular      Neuro/Psych    GI/Hepatic GERD  ,  Endo/Other    Renal/GU Renal disease     Musculoskeletal   Abdominal   Peds  Hematology   Anesthesia Other Findings Bladder ca. Poor R waves otherwise ok.  Reproductive/Obstetrics                             Anesthesia Physical Anesthesia Plan  ASA: III  Anesthesia Plan: General   Post-op Pain Management:    Induction: Intravenous  PONV Risk Score and Plan:   Airway Management Planned:   Additional Equipment:   Intra-op Plan:   Post-operative Plan:   Informed Consent: I have reviewed the patients History and Physical, chart, labs and discussed the procedure including the risks, benefits and alternatives for the proposed anesthesia with the patient or authorized representative who has indicated his/her understanding and acceptance.       Plan Discussed with: CRNA  Anesthesia Plan Comments:         Anesthesia Quick Evaluation

## 2019-04-24 NOTE — Anesthesia Postprocedure Evaluation (Signed)
Anesthesia Post Note  Patient: Kathleen Fitzgerald  Procedure(s) Performed: COLONOSCOPY WITH PROPOFOL (N/A ) ESOPHAGOGASTRODUODENOSCOPY (EGD) WITH PROPOFOL (N/A )  Patient location during evaluation: Endoscopy Anesthesia Type: General Level of consciousness: awake and alert Pain management: pain level controlled Vital Signs Assessment: post-procedure vital signs reviewed and stable Respiratory status: spontaneous breathing, nonlabored ventilation, respiratory function stable and patient connected to nasal cannula oxygen Cardiovascular status: blood pressure returned to baseline and stable Postop Assessment: no apparent nausea or vomiting Anesthetic complications: no     Last Vitals:  Vitals:   04/24/19 0820 04/24/19 0830  BP: 106/73 105/76  Pulse: 75 64  Resp: 10 15  Temp:    SpO2: 99% 98%    Last Pain:  Vitals:   04/24/19 0830  TempSrc:   PainSc: 0-No pain                 [name] S

## 2019-04-24 NOTE — Op Note (Signed)
[facility] Gastroenterology Patient Name: Kathleen Fitzgerald Procedure Date: 04/24/2019 6:58 AM MRN: [id number] Account #: [id number] Date of Birth: [birth year] Admit Type: Outpatient Age: 78 Room: [facility] ENDO ROOM 1 Gender: Female Note Status: Finalized Procedure:            Colonoscopy Indications:          Screening in patient at increased risk: Family history                        of 1st-degree relative with colorectal cancer Providers:            Manya Silvas, MD Medicines:            Propofol per Anesthesia Complications:        No immediate complications. Procedure:            Pre-Anesthesia Assessment:                       - After reviewing the risks and benefits, the patient                        was deemed in satisfactory condition to undergo the                        procedure.                       After obtaining informed consent, the colonoscope was                        passed under direct vision. Throughout the procedure,                        the patient's blood pressure, pulse, and oxygen                        saturations were monitored continuously. The                        Colonoscope was introduced through the anus and                        advanced to the the terminal ileum. The colonoscopy was                        performed without difficulty. The patient tolerated the                        procedure well. The quality of the bowel preparation                        was good. Findings:      A diminutive polyp was found in the rectum. The polyp was sessile. The       polyp was removed with a jumbo cold forceps. Resection and retrieval       were complete.      The terminal ileum appeared normal.      Internal hemorrhoids were found during endoscopy. The hemorrhoids were       small and Grade I (internal hemorrhoids that do not prolapse).      The exam was otherwise without abnormality.  Impression:           - One diminutive polyp  in the rectum, removed with a                        jumbo cold forceps. Resected and retrieved.                       - The examined portion of the ileum was normal.                       - Internal hemorrhoids.                       - The examination was otherwise normal. Recommendation:       - Await pathology results. Manya Silvas, MD 04/24/2019 8:10:42 AM This report has been signed electronically. Number of Addenda: 0 Note Initiated On: 04/24/2019 6:58 AM Scope Withdrawal Time: 0 hours 9 minutes 13 seconds  Total Procedure Duration: 0 hours 20 minutes 28 seconds       [facility]

## 2019-04-25 ENCOUNTER — Encounter: Payer: Self-pay | Admitting: Unknown Physician Specialty

## 2019-04-25 LAB — SURGICAL PATHOLOGY

## 2019-05-18 ENCOUNTER — Other Ambulatory Visit: Payer: Self-pay | Admitting: Specialist

## 2019-05-18 DIAGNOSIS — J439 Emphysema, unspecified: Secondary | ICD-10-CM

## 2019-05-18 DIAGNOSIS — R053 Chronic cough: Secondary | ICD-10-CM

## 2019-05-18 DIAGNOSIS — R05 Cough: Secondary | ICD-10-CM

## 2019-05-22 ENCOUNTER — Ambulatory Visit
Admission: RE | Admit: 2019-05-22 | Discharge: 2019-05-22 | Disposition: A | Discharge status: Home / Self Care | Payer: Medicare Other | Source: Ambulatory Visit | Attending: Specialist | Admitting: Specialist

## 2019-05-22 ENCOUNTER — Other Ambulatory Visit: Payer: Self-pay

## 2019-05-22 DIAGNOSIS — J439 Emphysema, unspecified: Secondary | ICD-10-CM | POA: Diagnosis not present

## 2019-05-22 DIAGNOSIS — R053 Chronic cough: Secondary | ICD-10-CM

## 2019-05-22 DIAGNOSIS — R05 Cough: Secondary | ICD-10-CM

## 2019-10-11 ENCOUNTER — Encounter
Admission: RE | Admit: 2019-10-11 | Discharge: 2019-10-11 | Disposition: A | Discharge status: Home / Self Care | Payer: Medicare Other | Source: Ambulatory Visit | Attending: Unknown Physician Specialty | Admitting: Unknown Physician Specialty

## 2019-10-11 ENCOUNTER — Other Ambulatory Visit: Payer: Self-pay

## 2019-10-11 DIAGNOSIS — Z01810 Encounter for preprocedural cardiovascular examination: Secondary | ICD-10-CM | POA: Diagnosis not present

## 2019-10-11 NOTE — Patient Instructions (Addendum)
Your procedure is scheduled on: Tuesday 10/17/19 Report to [place]. To find out your arrival time please call [phone] between 1PM - 3PM on Monday 10/16/19.  Remember: Instructions that are not followed completely may result in serious medical risk, up to and including death, or upon the discretion of your surgeon and anesthesiologist your surgery may need to be rescheduled.     _X__ 1. Do not eat food after midnight the night before your procedure.                 No gum chewing or hard candies. You may drink clear liquids up to 2 hours                 before you are scheduled to arrive for your surgery- DO not drink clear                 liquids within 2 hours of the start of your surgery.                 Clear Liquids include:  water, apple juice without pulp, clear carbohydrate                 drink such as Clearfast or Gatorade, Black Coffee or Tea (Do not add                 anything to coffee or tea). Diabetics water only  __X__2.  On the morning of surgery brush your teeth with toothpaste and water, you                 may rinse your mouth with mouthwash if you wish.  Do not swallow any              toothpaste of mouthwash.     _X__ 3.  No Alcohol for 24 hours before or after surgery.   _X__ 4.  Do Not Smoke or use e-cigarettes For 24 Hours Prior to Your Surgery.                 Do not use any chewable tobacco products for at least 6 hours prior to                 surgery.  ____  5.  Bring all medications with you on the day of surgery if instructed.   __X__  6.  Notify your doctor if there is any change in your medical condition      (cold, fever, infections).     Do not wear jewelry, make-up, hairpins, clips or nail polish. Do not wear lotions, powders, or perfumes.  Do not shave 48 hours prior to surgery. Men may shave face and neck. Do not bring valuables to the hospital.    [facility] is not responsible for  any belongings or valuables.  Contacts, dentures/partials or body piercings may not be worn into surgery. Bring a case for your contacts, glasses or hearing aids, a denture cup will be supplied. Leave your suitcase in the car. After surgery it may be brought to your room. For patients admitted to the hospital, discharge time is determined by your treatment team.   Patients discharged the day of surgery will not be allowed to drive home.   Please read over the following fact sheets that you were given:   MRSA Information  __X__ Take these medicines the morning of surgery with A SIP OF WATER:  1. none  2.   3.   4.  5.  6.  ____ Fleet Enema (as directed)   __X__ Use CHG Soap/SAGE wipes as directed  ____ Use inhalers on the day of surgery  ____ Stop metformin/Janumet/Farxiga 2 days prior to surgery    ____ Take 1/2 of usual insulin dose the night before surgery. No insulin the morning          of surgery.   ____ Stop Blood Thinners Coumadin/Plavix/Xarelto/Pleta/Pradaxa/Eliquis/Effient/Aspirin  on   Or contact your Surgeon, Cardiologist or Medical Doctor regarding  ability to stop your blood thinners  __X__ Stop Anti-inflammatories 7 days before surgery such as Advil, Ibuprofen, Motrin,  BC or Goodies Powder, Naprosyn, Naproxen, Aleve, Aspirin    __X__ Stop all herbal supplements, fish oil or vitamin E until after surgery.    ____ Bring C-Pap to the hospital.

## 2019-10-13 ENCOUNTER — Other Ambulatory Visit: Payer: Self-pay

## 2019-10-13 ENCOUNTER — Other Ambulatory Visit
Admission: RE | Admit: 2019-10-13 | Discharge: 2019-10-13 | Disposition: A | Discharge status: Home / Self Care | Payer: Medicare Other | Source: Ambulatory Visit | Attending: Unknown Physician Specialty | Admitting: Unknown Physician Specialty

## 2019-10-13 DIAGNOSIS — Z01812 Encounter for preprocedural laboratory examination: Secondary | ICD-10-CM | POA: Diagnosis present

## 2019-10-13 DIAGNOSIS — Z20828 Contact with and (suspected) exposure to other viral communicable diseases: Secondary | ICD-10-CM | POA: Insufficient documentation

## 2019-10-14 LAB — SARS CORONAVIRUS 2 (TAT 6-24 HRS): SARS Coronavirus 2: NEGATIVE

## 2019-10-17 ENCOUNTER — Ambulatory Visit
Admission: RE | Admit: 2019-10-17 | Discharge: 2019-10-17 | Disposition: A | Discharge status: Home / Self Care | Payer: Medicare Other | Attending: Unknown Physician Specialty | Admitting: Unknown Physician Specialty

## 2019-10-17 ENCOUNTER — Ambulatory Visit: Payer: Medicare Other | Admitting: Anesthesiology

## 2019-10-17 ENCOUNTER — Encounter
Admission: RE | Disposition: A | Discharge status: Home / Self Care | Payer: Self-pay | Source: Home / Self Care | Attending: Unknown Physician Specialty

## 2019-10-17 ENCOUNTER — Other Ambulatory Visit: Payer: Self-pay

## 2019-10-17 ENCOUNTER — Encounter: Payer: Self-pay | Admitting: Unknown Physician Specialty

## 2019-10-17 DIAGNOSIS — Z7982 Long term (current) use of aspirin: Secondary | ICD-10-CM | POA: Diagnosis not present

## 2019-10-17 DIAGNOSIS — Z885 Allergy status to narcotic agent status: Secondary | ICD-10-CM | POA: Insufficient documentation

## 2019-10-17 DIAGNOSIS — E559 Vitamin D deficiency, unspecified: Secondary | ICD-10-CM | POA: Diagnosis not present

## 2019-10-17 DIAGNOSIS — E785 Hyperlipidemia, unspecified: Secondary | ICD-10-CM | POA: Diagnosis not present

## 2019-10-17 DIAGNOSIS — Z87891 Personal history of nicotine dependence: Secondary | ICD-10-CM | POA: Insufficient documentation

## 2019-10-17 DIAGNOSIS — Z888 Allergy status to other drugs, medicaments and biological substances status: Secondary | ICD-10-CM | POA: Insufficient documentation

## 2019-10-17 DIAGNOSIS — K115 Sialolithiasis: Secondary | ICD-10-CM | POA: Insufficient documentation

## 2019-10-17 DIAGNOSIS — Z13820 Encounter for screening for osteoporosis: Secondary | ICD-10-CM

## 2019-10-17 HISTORY — PX: SUBMANDIBULAR GLAND EXCISION: SHX2456

## 2019-10-17 SURGERY — EXCISION, SUBMANDIBULAR GLAND
Anesthesia: General | Laterality: Left

## 2019-10-17 MED ORDER — DEXAMETHASONE SODIUM PHOSPHATE 10 MG/ML IJ SOLN
INTRAMUSCULAR | Status: DC | PRN
  Administered 2019-10-17: 10 mg via INTRAVENOUS

## 2019-10-17 MED ORDER — SODIUM CHLORIDE (PF) 0.9 % IJ SOLN
INTRAMUSCULAR | Status: AC
  Filled 2019-10-17: qty 10

## 2019-10-17 MED ORDER — KETAMINE HCL 50 MG/ML IJ SOLN
INTRAMUSCULAR | Status: AC
  Filled 2019-10-17: qty 10

## 2019-10-17 MED ORDER — FAMOTIDINE 20 MG PO TABS
ORAL_TABLET | ORAL | Status: AC
  Administered 2019-10-17: 20 mg via ORAL
  Filled 2019-10-17: qty 1

## 2019-10-17 MED ORDER — ONDANSETRON HCL 4 MG/2ML IJ SOLN
INTRAMUSCULAR | Status: AC
  Filled 2019-10-17: qty 2

## 2019-10-17 MED ORDER — LACTATED RINGERS IV SOLN: INTRAVENOUS | Status: DC

## 2019-10-17 MED ORDER — TRAMADOL HCL 50 MG PO TABS: 50.0000 mg | ORAL_TABLET | Freq: Four times a day (QID) | ORAL | 0 refills | Status: AC | PRN

## 2019-10-17 MED ORDER — SUCCINYLCHOLINE CHLORIDE 20 MG/ML IJ SOLN
INTRAMUSCULAR | Status: DC | PRN
  Administered 2019-10-17: 80 mg via INTRAVENOUS

## 2019-10-17 MED ORDER — LIDOCAINE HCL (CARDIAC) PF 100 MG/5ML IV SOSY
PREFILLED_SYRINGE | INTRAVENOUS | Status: DC | PRN
  Administered 2019-10-17: 60 mg via INTRAVENOUS

## 2019-10-17 MED ORDER — ONDANSETRON HCL 4 MG/2ML IJ SOLN
4.0000 mg | Freq: Once | INTRAMUSCULAR | Status: AC | PRN
  Administered 2019-10-17: 4 mg via INTRAVENOUS

## 2019-10-17 MED ORDER — SUCCINYLCHOLINE CHLORIDE 20 MG/ML IJ SOLN
INTRAMUSCULAR | Status: AC
  Filled 2019-10-17: qty 1

## 2019-10-17 MED ORDER — FENTANYL CITRATE (PF) 100 MCG/2ML IJ SOLN
INTRAMUSCULAR | Status: AC
  Filled 2019-10-17: qty 2

## 2019-10-17 MED ORDER — PHENYLEPHRINE HCL (PRESSORS) 10 MG/ML IV SOLN
INTRAVENOUS | Status: AC
  Filled 2019-10-17: qty 1

## 2019-10-17 MED ORDER — LIDOCAINE-EPINEPHRINE 1 %-1:100000 IJ SOLN
INTRAMUSCULAR | Status: DC | PRN
  Administered 2019-10-17: 2.5 mL

## 2019-10-17 MED ORDER — KETAMINE HCL 10 MG/ML IJ SOLN
INTRAMUSCULAR | Status: DC | PRN
  Administered 2019-10-17: 20 mg via INTRAVENOUS

## 2019-10-17 MED ORDER — ACETAMINOPHEN 10 MG/ML IV SOLN
INTRAVENOUS | Status: AC
  Filled 2019-10-17: qty 100

## 2019-10-17 MED ORDER — DEXMEDETOMIDINE HCL 200 MCG/2ML IV SOLN
INTRAVENOUS | Status: DC | PRN
  Administered 2019-10-17: 12 ug via INTRAVENOUS
  Administered 2019-10-17: 8 ug via INTRAVENOUS

## 2019-10-17 MED ORDER — PHENYLEPHRINE HCL (PRESSORS) 10 MG/ML IV SOLN
INTRAVENOUS | Status: DC | PRN
  Administered 2019-10-17 (×6): 100 ug via INTRAVENOUS

## 2019-10-17 MED ORDER — FENTANYL CITRATE (PF) 100 MCG/2ML IJ SOLN
INTRAMUSCULAR | Status: DC | PRN
  Administered 2019-10-17: 25 ug via INTRAVENOUS
  Administered 2019-10-17: 50 ug via INTRAVENOUS

## 2019-10-17 MED ORDER — PROPOFOL 10 MG/ML IV BOLUS
INTRAVENOUS | Status: AC
  Filled 2019-10-17: qty 20

## 2019-10-17 MED ORDER — BACITRACIN ZINC 500 UNIT/GM EX OINT
TOPICAL_OINTMENT | CUTANEOUS | Status: AC
  Filled 2019-10-17: qty 28.35

## 2019-10-17 MED ORDER — DEXAMETHASONE SODIUM PHOSPHATE 10 MG/ML IJ SOLN
INTRAMUSCULAR | Status: AC
  Filled 2019-10-17: qty 1

## 2019-10-17 MED ORDER — LIDOCAINE-EPINEPHRINE 1 %-1:100000 IJ SOLN
INTRAMUSCULAR | Status: AC
  Filled 2019-10-17: qty 1

## 2019-10-17 MED ORDER — FENTANYL CITRATE (PF) 100 MCG/2ML IJ SOLN: 25.0000 ug | INTRAMUSCULAR | Status: DC | PRN

## 2019-10-17 MED ORDER — PROPOFOL 10 MG/ML IV BOLUS
INTRAVENOUS | Status: DC | PRN
  Administered 2019-10-17: 100 mg via INTRAVENOUS

## 2019-10-17 MED ORDER — LIDOCAINE HCL (PF) 2 % IJ SOLN
INTRAMUSCULAR | Status: AC
  Filled 2019-10-17: qty 10

## 2019-10-17 MED ORDER — FAMOTIDINE 20 MG PO TABS: 20.0000 mg | ORAL_TABLET | Freq: Once | ORAL | Status: AC

## 2019-10-17 MED ORDER — ONDANSETRON HCL 4 MG/2ML IJ SOLN
INTRAMUSCULAR | Status: DC | PRN
  Administered 2019-10-17: 4 mg via INTRAVENOUS

## 2019-10-17 MED ORDER — ACETAMINOPHEN 10 MG/ML IV SOLN
INTRAVENOUS | Status: DC | PRN
  Administered 2019-10-17: 1000 mg via INTRAVENOUS

## 2019-10-17 MED ORDER — DEXMEDETOMIDINE HCL IN NACL 80 MCG/20ML IV SOLN
INTRAVENOUS | Status: AC
  Filled 2019-10-17: qty 20

## 2019-10-17 SURGICAL SUPPLY — 47 items
BLADE SURG 15 STRL LF DISP TIS (BLADE) ×1 IMPLANT
BLADE SURG 15 STRL SS (BLADE) ×2
BULB RESERV EVAC DRAIN JP 100C (MISCELLANEOUS) ×2 IMPLANT
CANISTER SUCT 1200ML W/VALVE (MISCELLANEOUS) ×3 IMPLANT
CORD BIP STRL DISP 12FT (MISCELLANEOUS) ×3 IMPLANT
COVER WAND RF STERILE (DRAPES) ×3 IMPLANT
DERMABOND ADVANCED (GAUZE/BANDAGES/DRESSINGS) ×2
DERMABOND ADVANCED .7 DNX12 (GAUZE/BANDAGES/DRESSINGS) ×1 IMPLANT
DRAIN TLS ROUND 10FR (DRAIN) ×2 IMPLANT
DRAPE MAG INST 16X20 L/F (DRAPES) ×3 IMPLANT
DRSG TEGADERM 2-3/8X2-3/4 SM (GAUZE/BANDAGES/DRESSINGS) ×4 IMPLANT
DRSG TEGADERM 2X2.25 PEDS (GAUZE/BANDAGES/DRESSINGS) ×2 IMPLANT
ELECT EMG 20MM DUAL (MISCELLANEOUS) ×3
ELECT NEEDLE 20X.3 GREEN (MISCELLANEOUS) ×3
ELECT REM PT RETURN 9FT ADLT (ELECTROSURGICAL) ×3
ELECTRODE EMG 20MM DUAL (MISCELLANEOUS) ×1 IMPLANT
ELECTRODE NDL 20X.3 GREEN (MISCELLANEOUS) ×1 IMPLANT
ELECTRODE NEEDLE 20X.3 GREEN (MISCELLANEOUS) ×1 IMPLANT
ELECTRODE REM PT RTRN 9FT ADLT (ELECTROSURGICAL) ×1 IMPLANT
FORCEPS JEWEL BIP 4-3/4 STR (INSTRUMENTS) ×3 IMPLANT
GAUZE 4X4 16PLY RFD (DISPOSABLE) ×3 IMPLANT
GLOVE BIO SURGEON STRL SZ7.5 (GLOVE) ×3 IMPLANT
GOWN STRL REUS W/ TWL LRG LVL3 (GOWN DISPOSABLE) ×3 IMPLANT
GOWN STRL REUS W/TWL LRG LVL3 (GOWN DISPOSABLE) ×6
HEMOSTAT SURGICEL 2X3 (HEMOSTASIS) ×3 IMPLANT
HOOK STAY BLUNT/RETRACTOR 5M (MISCELLANEOUS) IMPLANT
JACKSON PRATT 7MM (INSTRUMENTS) IMPLANT
LABEL OR SOLS (LABEL) ×3 IMPLANT
MARKER SKIN DUAL TIP RULER LAB (MISCELLANEOUS) ×3 IMPLANT
NS IRRIG 500ML POUR BTL (IV SOLUTION) ×3 IMPLANT
PACK HEAD/NECK (MISCELLANEOUS) ×3 IMPLANT
PROBE MONO 100X0.75 ELECT 1.9M (MISCELLANEOUS) ×3 IMPLANT
SHEARS HARMONIC 9CM CVD (BLADE) ×3 IMPLANT
SPONGE KITTNER 5P (MISCELLANEOUS) ×3 IMPLANT
STAPLER SKIN PROX 35W (STAPLE) ×3 IMPLANT
SUT ETHILON 4-0 (SUTURE) ×2
SUT ETHILON 4-0 FS2 18XMFL BLK (SUTURE) ×1
SUT SILK 0 (SUTURE) ×2
SUT SILK 0 30XBRD TIE 6 (SUTURE) ×1 IMPLANT
SUT SILK 2 0 (SUTURE) ×2
SUT SILK 2 0 SH (SUTURE) ×3 IMPLANT
SUT SILK 2-0 18XBRD TIE 12 (SUTURE) ×1 IMPLANT
SUT SILK 3 0 (SUTURE) ×2
SUT SILK 3-0 18XBRD TIE 12 (SUTURE) ×1 IMPLANT
SUT VIC AB 4-0 RB1 18 (SUTURE) ×6 IMPLANT
SUTURE ETHLN 4-0 FS2 18XMF BLK (SUTURE) ×1 IMPLANT
SYSTEM CHEST DRAIN TLS 7FR (DRAIN) IMPLANT

## 2019-10-17 NOTE — Anesthesia Postprocedure Evaluation (Signed)
Anesthesia Post Note  Patient: Kathleen Fitzgerald  Procedure(s) Performed: EXCISION SUBMANDIBULAR GLAND (Left )  Patient location during evaluation: PACU Anesthesia Type: General Level of consciousness: awake and alert and oriented Pain management: pain level controlled Vital Signs Assessment: post-procedure vital signs reviewed and stable Respiratory status: spontaneous breathing, nonlabored ventilation and respiratory function stable Cardiovascular status: blood pressure returned to baseline and stable Postop Assessment: no signs of nausea or vomiting Anesthetic complications: no     Last Vitals:  Vitals:   10/17/19 1003 10/17/19 1015  BP: (!) 103/36   Pulse: 63 61  Resp: 13 13  Temp:    SpO2: 93% 93%    Last Pain:  Vitals:   10/17/19 1000  TempSrc:   PainSc: 0-No pain                 Efe Fazzino

## 2019-10-17 NOTE — Anesthesia Preprocedure Evaluation (Signed)
Anesthesia Evaluation  Patient identified by MRN, date of birth, ID band Patient awake    Reviewed: Allergy & Precautions, NPO status , Patient's Chart, lab work & pertinent test results  History of Anesthesia Complications Negative for: history of anesthetic complications  Airway Mallampati: II  TM Distance: >3 FB Neck ROM: Full    Dental no notable dental hx.    Pulmonary neg sleep apnea, neg COPD, former smoker,    breath sounds clear to auscultation- rhonchi (-) wheezing      Cardiovascular Exercise Tolerance: Good (-) hypertension(-) CAD, (-) Past MI, (-) Cardiac Stents and (-) CABG  Rhythm:Regular Rate:Normal - Systolic murmurs and - Diastolic murmurs    Neuro/Psych neg Seizures negative neurological ROS  negative psych ROS   GI/Hepatic Neg liver ROS, hiatal hernia, GERD  ,  Endo/Other  negative endocrine ROSneg diabetes  Renal/GU Renal disease: hx of nephrolithiasis.     Musculoskeletal negative musculoskeletal ROS (+)   Abdominal (+) - obese,   Peds  Hematology negative hematology ROS (+)   Anesthesia Other Findings Past Medical History: No date: BPPV (benign paroxysmal positional vertigo)     Comment:  2 episodes No date: Carotid stenosis 2004: Chronic cough     Comment:  Has been evaluated with no etiology No date: Chronic kidney disease     Comment:  KIDNEY STONES No date: GERD (gastroesophageal reflux disease) No date: History of chicken pox     Comment:  childhood No date: History of hiatal hernia 09/19/2018: Hx of squamous cell carcinoma of skin     Comment:  L upper arm No date: Hyperlipidemia No date: IBS (irritable bowel syndrome) No date: Personal history of colonic polyps     Comment:  Colonoscopy 04/12/13 - Repeat 5 years - polys 1975 and 2006: Sebaceous cyst     Comment:  Scalp No date: Vertigo No date: Vitamin D deficiency   Reproductive/Obstetrics                             Anesthesia Physical Anesthesia Plan  ASA: II  Anesthesia Plan: General   Post-op Pain Management:    Induction: Intravenous  PONV Risk Score and Plan: 2 and Ondansetron and Dexamethasone  Airway Management Planned: Oral ETT  Additional Equipment:   Intra-op Plan:   Post-operative Plan: Extubation in OR  Informed Consent: I have reviewed the patients History and Physical, chart, labs and discussed the procedure including the risks, benefits and alternatives for the proposed anesthesia with the patient or authorized representative who has indicated his/her understanding and acceptance.     Dental advisory given  Plan Discussed with: CRNA and Anesthesiologist  Anesthesia Plan Comments:         Anesthesia Quick Evaluation

## 2019-10-17 NOTE — H&P (Signed)
The patient's history has been reviewed, patient examined, no change in status, stable for surgery.  Questions were answered to the patients satisfaction.  

## 2019-10-17 NOTE — Op Note (Signed)
10/17/2019  8:39 AM    Carlyon Shadow  UB:[id number]   Pre-Op Dx: Marylynn Pearson  Post-op Dx: SAME  Proc: Facial nerve monitoring 1 hour; excision left submandibular gland  Surg:  Roena Malady  Assistant: Vaught Anes:  GOT  EBL: Less than 5 cc  Comp: None  Findings: Slightly sclerotic gland with a ductal stone.  Procedure: Analea was identified in the holding area take the operating placed in supine position.  After general endotracheal anesthesia the table is turned 90 degrees.  A facial nerve monitor was applied to the lower lip and remained on throughout the procedure.  Incision line was marked approximately 2 fingerbreadths below the angle of the mandible.  This was overlying the submandibular gland.  A local anesthetic of 1% lidocaine with 1 100,000 units of epinephrine was used to inject over the incision site a total of 2-1/2 cc was used.  The face and neck were then prepped and draped sterilely.  A 15 blade was used to incise down to and through the platysma muscle.  Hemostasis achieved using the Bovie cautery.  The marginal branch of the facial nerve was identified using the nerve stimulator and was retracted superiorly throughout the remainder the case.  The submandibular gland was identified the capsule was adhered to and dissected free using the harmonic scalpel.  There were multiple feeding vessels which were divided using harmonic scalpel and 2-0 silk suture ligature.  The gland was then traced superiorly to the mylohyoid muscle.  The mylohyoid muscle was retracted superiorly the lingual nerve was identified.  Submandibular gland ganglion was then divided using the harmonic scalpel allowing the the lingual nerve to retract superiorly.  The duct was identified there was a large stone in this duct.  The duct was traced up towards the floor mouth above the stone clamp was placed across the duct and it was divided using harmonic scalpel.  A 2-0 silk suture ligature was placed on  the ductal stump.  The gland was thus removed with the stone in position.  The wound was then copiously irrigated with saline there was no active bleeding.  Surgicel was placed in the wound bed and #10 TLS drain was brought out the wound inferiorly.  The platysma layer was closed using the 4-0 Vicryl, the subcutaneous tissues were closed using 4-0 Vicryl and the skin was closed using Dermabond.  A 4-0 nylon was used as a drain stitch.  The bulb was loaded and held suction.  The patient was then returned to anesthesia where she was awakened in the operating type cart in stable condition.  Specimen: Left submandibular gland  Dispo:   Good  Plan: Discharged home with follow-up tomorrow for drain removal.  Roena Malady  10/17/2019 8:39 AM

## 2019-10-17 NOTE — Progress Notes (Signed)
Spoke with Dr Randa Lynn who came to see pt, pt states she feels bad, no longer nauseate but dizzy, will let wake up a little while longer

## 2019-10-17 NOTE — Transfer of Care (Signed)
Immediate Anesthesia Transfer of Care Note  Patient: Kathleen Fitzgerald  Procedure(s) Performed: EXCISION SUBMANDIBULAR GLAND (Left )  Patient Location: PACU  Anesthesia Type:General  Level of Consciousness: drowsy and patient cooperative  Airway & Oxygen Therapy: Patient Spontanous Breathing and Patient connected to face mask oxygen  Post-op Assessment: Report given to RN and Post -op Vital signs reviewed and stable  Post vital signs: Reviewed and stable  Last Vitals:  Vitals Value Taken Time  BP 128/63 10/17/19 0848  Temp    Pulse 61 10/17/19 0851  Resp 17 10/17/19 0851  SpO2 100 % 10/17/19 0851  Vitals shown include unvalidated device data.  Last Pain:  Vitals:   10/17/19 0619  TempSrc: Tympanic  PainSc: 0-No pain         Complications: No apparent anesthesia complications

## 2019-10-17 NOTE — Discharge Instructions (Signed)

## 2019-10-17 NOTE — Anesthesia Procedure Notes (Signed)
Procedure Name: Intubation Date/Time: 10/17/2019 7:40 AM Performed by: Lia Foyer, CRNA Pre-anesthesia Checklist: Patient identified, Emergency Drugs available, Suction available and Patient being monitored Patient Re-evaluated:Patient Re-evaluated prior to induction Oxygen Delivery Method: Circle system utilized Preoxygenation: Pre-oxygenation with 100% oxygen Induction Type: IV induction Ventilation: Mask ventilation without difficulty Laryngoscope Size: McGraph and 3 Grade View: Grade I Tube type: Oral Tube size: 7.0 mm Number of attempts: 1 Airway Equipment and Method: Stylet and Oral airway Placement Confirmation: ETT inserted through vocal cords under direct vision,  positive ETCO2 and breath sounds checked- equal and bilateral Secured at: 20 cm Tube secured with: Tape Dental Injury: Teeth and Oropharynx as per pre-operative assessment

## 2019-10-17 NOTE — Anesthesia Post-op Follow-up Note (Signed)
Anesthesia QCDR form completed.        

## 2019-10-18 LAB — SURGICAL PATHOLOGY

## 2020-09-03 ENCOUNTER — Other Ambulatory Visit: Payer: Self-pay

## 2020-09-03 ENCOUNTER — Ambulatory Visit (INDEPENDENT_AMBULATORY_CARE_PROVIDER_SITE_OTHER): Payer: Medicare Other | Admitting: Dermatology

## 2020-09-03 DIAGNOSIS — L820 Inflamed seborrheic keratosis: Secondary | ICD-10-CM

## 2020-09-03 DIAGNOSIS — Z1283 Encounter for screening for malignant neoplasm of skin: Secondary | ICD-10-CM | POA: Diagnosis not present

## 2020-09-03 DIAGNOSIS — D229 Melanocytic nevi, unspecified: Secondary | ICD-10-CM

## 2020-09-03 DIAGNOSIS — L578 Other skin changes due to chronic exposure to nonionizing radiation: Secondary | ICD-10-CM | POA: Diagnosis not present

## 2020-09-03 DIAGNOSIS — D485 Neoplasm of uncertain behavior of skin: Secondary | ICD-10-CM

## 2020-09-03 DIAGNOSIS — Z85828 Personal history of other malignant neoplasm of skin: Secondary | ICD-10-CM | POA: Diagnosis not present

## 2020-09-03 DIAGNOSIS — L821 Other seborrheic keratosis: Secondary | ICD-10-CM | POA: Diagnosis not present

## 2020-09-03 DIAGNOSIS — D1800 Hemangioma unspecified site: Secondary | ICD-10-CM

## 2020-09-03 DIAGNOSIS — D489 Neoplasm of uncertain behavior, unspecified: Secondary | ICD-10-CM

## 2020-09-03 DIAGNOSIS — L814 Other melanin hyperpigmentation: Secondary | ICD-10-CM

## 2020-09-03 NOTE — Patient Instructions (Signed)
Wound Care Instructions ° °1. Cleanse wound gently with soap and water once a day then pat dry with clean gauze. Apply a thing coat of Petrolatum (petroleum jelly, "Vaseline") over the wound (unless you have an allergy to this). We recommend that you use a new, sterile tube of Vaseline. Do not pick or remove scabs. Do not remove the yellow or white "healing tissue" from the base of the wound. ° °2. Cover the wound with fresh, clean, nonstick gauze and secure with paper tape. You may use Band-Aids in place of gauze and tape if the would is small enough, but would recommend trimming much of the tape off as there is often too much. Sometimes Band-Aids can irritate the skin. ° °3. You should call the office for your biopsy report after 1 week if you have not already been contacted. ° °4. If you experience any problems, such as abnormal amounts of bleeding, swelling, significant bruising, significant pain, or evidence of infection, please call the office immediately. ° °Cryotherapy Aftercare ° °• Wash gently with soap and water everyday.   °• Apply Vaseline and Band-Aid daily until healed. ° ° °

## 2020-09-03 NOTE — Progress Notes (Signed)
Follow-Up Visit   Subjective  Kathleen Fitzgerald is a 79 y.o. female who presents for the following: Annual Exam (Hx SCC L upper arm). She has several spots to check on her legs, left wrist, and posterior hairline. Some are bothersome and itchy.  The following portions of the chart were reviewed this encounter and updated as appropriate:      Review of Systems:  No other skin or systemic complaints except as noted in HPI or Assessment and Plan.  Objective  Well appearing patient in no apparent distress; mood and affect are within normal limits.  A full examination was performed including scalp, head, eyes, ears, nose, lips, neck, chest, axillae, abdomen, back, buttocks, bilateral upper extremities, bilateral lower extremities, hands, feet, fingers, toes, fingernails, and toenails. All findings within normal limits unless otherwise noted below.  Objective  L lower calf x 1, L ant thigh x 1, R upper knee x 1, L wrist x 1, R lower calf x 1 (5): Erythematous keratotic or waxy stuck-on papule  Objective  Posterior neck: 6.47mm firm flesh papule   Assessment & Plan   Skin cancer screening performed today.  Actinic Damage - diffuse scaly erythematous macules with underlying dyspigmentation - Recommend daily broad spectrum sunscreen SPF 30+ to sun-exposed areas, reapply every 2 hours as needed.  - Call for new or changing lesions.  Seborrheic Keratoses - Stuck-on, waxy, tan-brown papules and plaques  - Discussed benign etiology and prognosis. - Observe - Call for any changes  Lentigines - Scattered tan macules - Discussed due to sun exposure - Benign, observe - Call for any changes  Melanocytic Nevi - Tan-brown and/or pink-flesh-colored symmetric macules and papules - Benign appearing on exam today - Observation - Call clinic for new or changing moles - Recommend daily use of broad spectrum spf 30+ sunscreen to sun-exposed areas.   Hemangiomas - Red papules on chest,  abdomen - Discussed benign nature - Observe - Call for any changes   Inflamed seborrheic keratosis (5) L lower calf x 1, L ant thigh x 1, R upper knee x 1, L wrist x 1, R lower calf x 1  Destruction of lesion - L lower calf x 1, L ant thigh x 1, R upper knee x 1, L wrist x 1, R lower calf x 1  Destruction method: cryotherapy   Informed consent: discussed and consent obtained   Lesion destroyed using liquid nitrogen: Yes   Region frozen until ice ball extended beyond lesion: Yes   Outcome: patient tolerated procedure well with no complications   Post-procedure details: wound care instructions given    Neoplasm of uncertain behavior Posterior neck  Epidermal / dermal shaving  Lesion diameter (cm):  0.6 Informed consent: discussed and consent obtained   Patient was prepped and draped in usual sterile fashion: Area prepped with alcohol. Anesthesia: the lesion was anesthetized in a standard fashion   Anesthetic:  1% lidocaine w/ epinephrine 1-100,000 buffered w/ 8.4% NaHCO3 Instrument used: flexible razor blade   Hemostasis achieved with: pressure, aluminum chloride and electrodesiccation   Outcome: patient tolerated procedure well   Post-procedure details: wound care instructions given   Post-procedure details comment:  Ointment and small bandage applied Additional details:  Post tx defect 8.76mm  Specimen 1 - Surgical pathology Differential Diagnosis: r/o Irritated Nevus vs Cyst Check Margins: No 4.81mm firm flesh papule    Return in about 1 year (around 09/03/2021) for TBSE.  IJamesetta Orleans, CMA, am acting as Education administrator for [employer]  Nicole Kindred, MD .  Documentation: I have reviewed the above documentation for accuracy and completeness, and I agree with the above.  Brendolyn Patty MD

## 2020-09-10 ENCOUNTER — Telehealth: Payer: Self-pay

## 2020-09-10 NOTE — Telephone Encounter (Signed)
Advised patient biopsy was benign.  

## 2020-09-10 NOTE — Telephone Encounter (Signed)
-----   Message from Brendolyn Patty, MD sent at 09/09/2020  1:29 PM EST ----- Skin , posterior neck FOLLICULAR CYST, INFUNDIBULAR TYPE (EPIDERMOID CYST)  Benign cyst

## 2020-10-03 ENCOUNTER — Encounter (INDEPENDENT_AMBULATORY_CARE_PROVIDER_SITE_OTHER): Payer: Self-pay | Admitting: Vascular Surgery

## 2020-10-03 ENCOUNTER — Other Ambulatory Visit: Payer: Self-pay

## 2020-10-03 ENCOUNTER — Ambulatory Visit (INDEPENDENT_AMBULATORY_CARE_PROVIDER_SITE_OTHER): Payer: Medicare Other | Admitting: Vascular Surgery

## 2020-10-03 ENCOUNTER — Ambulatory Visit (INDEPENDENT_AMBULATORY_CARE_PROVIDER_SITE_OTHER): Payer: Medicare Other

## 2020-10-03 VITALS — BP 122/73 | HR 91 | Resp 16 | Wt 128.6 lb

## 2020-10-03 DIAGNOSIS — E785 Hyperlipidemia, unspecified: Secondary | ICD-10-CM

## 2020-10-03 DIAGNOSIS — I6523 Occlusion and stenosis of bilateral carotid arteries: Secondary | ICD-10-CM

## 2020-10-03 DIAGNOSIS — K219 Gastro-esophageal reflux disease without esophagitis: Secondary | ICD-10-CM | POA: Diagnosis not present

## 2020-10-11 ENCOUNTER — Ambulatory Visit (INDEPENDENT_AMBULATORY_CARE_PROVIDER_SITE_OTHER): Payer: Medicare Other

## 2020-10-11 ENCOUNTER — Encounter (INDEPENDENT_AMBULATORY_CARE_PROVIDER_SITE_OTHER): Payer: Self-pay | Admitting: Vascular Surgery

## 2020-10-11 NOTE — Progress Notes (Signed)
MRN : [id number]  Kathleen Fitzgerald is a 79 y.o. (1941/03/22) female who presents with chief complaint of  Chief Complaint  Patient presents with  . Follow-up    ultrasound follow up  .  History of Present Illness:   The patient is seen for evaluation of carotid stenosis. The carotid stenosis is followed with semiannual duplex ultrasound.  The patient denies amaurosis fugax. There is no recent history of TIA symptoms or focal motor deficits. There is no prior documented CVA.  There is no history of migraine headaches. There is no history of seizures.  The patient is taking enteric-coated aspirin 81 mg daily.  The patient has a history of coronary artery disease, no recent episodes of angina or shortness of breath. The patient denies PAD or claudication symptoms. There is a history of hyperlipidemia which is being treated with a statin.  Duplex ultrasound is stable compared to the previous study.  The right ICA and the left ICA stenosis (<40%). Bilateral vertebral arteries demonstrate antegrade flow. Normal flow hemodynamics were seen in the by lateral subclavian arteries.  Current Meds  Medication Sig  . Alpha-Lipoic Acid 600 MG CAPS Take by mouth.  Marland Kitchen aspirin EC 81 MG tablet Take 81 mg by mouth daily. Swallow whole.  . Calcium Carb-Cholecalciferol (CALCIUM 600+D3 PO) Take 1 tablet by mouth 2 (two) times daily.  . Cholecalciferol (VITAMIN D3) 50 MCG (2000 UT) TABS Take 2,000 Units by mouth daily with lunch.  . FIBER ADULT GUMMIES PO Take 2 tablets by mouth daily. Fiber Well gummies  . Multiple Vitamin (MULTIVITAMIN WITH MINERALS) TABS tablet Take 1 tablet by mouth daily.  . traMADol (ULTRAM) 50 MG tablet Take 1 tablet (50 mg total) by mouth every 6 (six) hours as needed.    Past Medical History:  Diagnosis Date  . BPPV (benign paroxysmal positional vertigo)    2 episodes  . Carotid stenosis   . Chronic cough 2004   Has been evaluated with no etiology  . Chronic  kidney disease    KIDNEY STONES  . GERD (gastroesophageal reflux disease)   . History of chicken pox    childhood  . History of hiatal hernia   . 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 Hyperlipidemia   . IBS (irritable bowel syndrome)   . Personal history of colonic polyps    Colonoscopy 04/12/13 - Repeat 5 years - polys  . Sebaceous cyst 1975 and 2006   Scalp  . Vertigo   . Vitamin D deficiency     Past Surgical History:  Procedure Laterality Date  . BREAST SURGERY Left 2000   biopsy - benign  . bunion repair Right 12/1988  . COLONOSCOPY    . COLONOSCOPY WITH PROPOFOL N/A 04/24/2019   Procedure: COLONOSCOPY WITH PROPOFOL;  Surgeon: Manya Silvas, MD;  Location: [facility] ENDOSCOPY;  Service: Endoscopy;  Laterality: N/A;  . CYST EXCISION     HEAD AND LEGS  . ESOPHAGOGASTRODUODENOSCOPY (EGD) WITH PROPOFOL N/A 04/24/2019   Procedure: ESOPHAGOGASTRODUODENOSCOPY (EGD) WITH PROPOFOL;  Surgeon: Manya Silvas, MD;  Location: [facility] ENDOSCOPY;  Service: Endoscopy;  Laterality: N/A;  . HALLUX VALGUS AUSTIN Right 04/12/2015   Procedure: Right hallux valgus correction, aiken osteotomy ;  Surgeon: Albertine Patricia, DPM;  Location: [facility] ORS;  Service: Podiatry;  Laterality: Right;  excisions exostosis 2nd toe  . SUBMANDIBULAR GLAND EXCISION Left 10/17/2019   Procedure: EXCISION SUBMANDIBULAR GLAND;  Surgeon: Beverly Gust, MD;  Location: [facility] ORS;  Service: ENT;  Laterality: Left;    Social History Social History   Tobacco Use  . Smoking status: Former Smoker    Packs/day: 2.00    Years: 33.00    Pack years: 66.00    Quit date: 11/03/1991    Years since quitting: 28.9  . Smokeless tobacco: Never Used  Vaping Use  . Vaping Use: Never used  Substance Use Topics  . Alcohol use: Yes    Comment: wine - rare  . Drug use: No    Family History Family History  Problem Relation Age of Onset  . Breast cancer Mother   . Hypertension Mother   . Colon cancer  Father   . Alcohol abuse Father   . Stroke Father   . Breast cancer Sister   . COPD Sister     Allergies  Allergen Reactions  . Donnatal [Pb-Hyoscy-Atropine-Scopolamine] Other (See Comments)    Derealization feeling/"spaced out feeling"  . Oxycodone Other (See Comments)    Dizziness/"out of my head"        REVIEW OF SYSTEMS (Negative unless checked)  Constitutional: [] Weight loss  [] Fever  [] Chills Cardiac: [] Chest pain   [] Chest pressure   [] Palpitations   [] Shortness of breath when laying flat   [] Shortness of breath with exertion. Vascular:  [] Pain in legs with walking   [] Pain in legs at rest  [] History of DVT   [] Phlebitis   [] Swelling in legs   [] Varicose veins   [] Non-healing ulcers Pulmonary:   [] Uses home oxygen   [] Productive cough   [] Hemoptysis   [] Wheeze  [] COPD   [] Asthma Neurologic:  [] Dizziness   [] Seizures   [] History of stroke   [] History of TIA  [] Aphasia   [] Vissual changes   [] Weakness or numbness in arm   [] Weakness or numbness in leg Musculoskeletal:   [] Joint swelling   [] Joint pain   [] Low back pain Hematologic:  [] Easy bruising  [] Easy bleeding   [] Hypercoagulable state   [] Anemic Gastrointestinal:  [] Diarrhea   [] Vomiting  [x] Gastroesophageal reflux/heartburn   [] Difficulty swallowing. Genitourinary:  [] Chronic kidney disease   [] Difficult urination  [] Frequent urination   [] Blood in urine Skin:  [] Rashes   [] Ulcers  Psychological:  [] History of anxiety   []  History of major depression.  Physical Examination  Vitals:   10/03/20 1446  BP: 122/73  Pulse: 91  Resp: 16  Weight: 128 lb 9.6 oz (58.3 kg)   Body mass index is 22.78 kg/m. Gen: WD/WN, NAD Head: [place]/AT, No temporalis wasting.  Ear/Nose/Throat: Hearing grossly intact, nares w/o erythema or drainage Eyes: PER, EOMI, sclera nonicteric.  Neck: Supple, no large masses.   Pulmonary:  Good air movement, no audible wheezing bilaterally, no use of accessory muscles.  Cardiac: RRR, no  JVD Vascular: Bilateral carotid bruits Vessel Right Left  Radial Palpable Palpable  Carotid Palpable Palpable  Gastrointestinal: Non-distended. No guarding/no peritoneal signs.  Musculoskeletal: M/S 5/5 throughout.  No deformity or atrophy.  Neurologic: CN 2-12 intact. Symmetrical.  Speech is fluent. Motor exam as listed above. Psychiatric: Judgment intact, Mood & affect appropriate for pt's clinical situation. Dermatologic: No rashes or ulcers noted.  No changes consistent with cellulitis.  CBC Lab Results  Component Value Date   WBC 7.0 04/03/2014   HGB 14.6 04/03/2014   HCT 44.3 04/03/2014   MCV 93.6 04/03/2014   PLT 311.0 04/03/2014    BMET    Component Value Date/Time   NA 138 04/03/2014 0835   K 4.2  04/03/2014 0835   CL 102 04/03/2014 0835   CO2 29 04/03/2014 0835   GLUCOSE 100 (H) 04/03/2014 0835   BUN 17 04/03/2014 0835   CREATININE 0.70 07/22/2018 1130   CALCIUM 9.4 04/03/2014 0835   CrCl cannot be calculated (Patient's most recent lab result is older than the maximum 21 days allowed.).  COAG No results found for: INR, PROTIME  Radiology VAS US CAROTID  Result Date: 10/03/2020 Carotid Arterial Duplex Study Indications:       Carotid artery disease. Comparison Study:  04/03/2019 Performing Technologist: Almira Coaster RVS  Examination Guidelines: A complete evaluation includes B-mode imaging, spectral Doppler, color Doppler, and power Doppler as needed of all accessible portions of each vessel. Bilateral testing is considered an integral part of a complete examination. Limited examinations for reoccurring indications may be performed as noted.  Right Carotid Findings: +----------+--------+--------+--------+------------------+--------+           PSV cm/sEDV cm/sStenosisPlaque DescriptionComments +----------+--------+--------+--------+------------------+--------+ CCA Prox  109     19                                          +----------+--------+--------+--------+------------------+--------+ CCA Mid   93      15                                         +----------+--------+--------+--------+------------------+--------+ CCA Distal102     15                                         +----------+--------+--------+--------+------------------+--------+ ICA Prox  81      13                                         +----------+--------+--------+--------+------------------+--------+ ICA Mid   85      22                                         +----------+--------+--------+--------+------------------+--------+ ICA Distal120     27                                tortuous +----------+--------+--------+--------+------------------+--------+ ECA       167     12                                         +----------+--------+--------+--------+------------------+--------+ +----------+--------+-------+--------+-------------------+           PSV cm/sEDV cmsDescribeArm Pressure (mmHG) +----------+--------+-------+--------+-------------------+ Subclavian218     0                                  +----------+--------+-------+--------+-------------------+ +---------+--------+--+--------+--+ VertebralPSV cm/s41EDV cm/s12 +---------+--------+--+--------+--+  Left Carotid Findings: +----------+--------+--------+--------+------------------+--------+           PSV cm/sEDV cm/sStenosisPlaque DescriptionComments +----------+--------+--------+--------+------------------+--------+ CCA Prox  116  29                                         +----------+--------+--------+--------+------------------+--------+ CCA Mid   116     30                                         +----------+--------+--------+--------+------------------+--------+ CCA Distal125     31                                         +----------+--------+--------+--------+------------------+--------+ ICA Prox  82      16                                          +----------+--------+--------+--------+------------------+--------+ ICA Mid   99      23                                         +----------+--------+--------+--------+------------------+--------+ ICA Distal105     20                                         +----------+--------+--------+--------+------------------+--------+ ECA       81                                                 +----------+--------+--------+--------+------------------+--------+ +----------+--------+--------+--------+-------------------+           PSV cm/sEDV cm/sDescribeArm Pressure (mmHG) +----------+--------+--------+--------+-------------------+ Subclavian231     0                                   +----------+--------+--------+--------+-------------------+ +---------+--------+---+--------+--+ VertebralPSV cm/s105EDV cm/s25 +---------+--------+---+--------+--+   Summary: Right Carotid: Velocities in the right ICA are consistent with a 1-39% stenosis. Left Carotid: Velocities in the left ICA are consistent with a 1-39% stenosis. Vertebrals:  Bilateral vertebral arteries demonstrate antegrade flow. Subclavians: Normal flow hemodynamics were seen in bilateral subclavian              arteries. *See table(s) above for measurements and observations.  Electronically signed by Hortencia Pilar MD on 10/03/2020 at 4:15:11 PM.    Final      Assessment/Plan 1. Bilateral carotid artery stenosis Recommend:  Given the patient's asymptomatic subcritical stenosis no further invasive testing or surgery at this time.  Duplex ultrasound shows <40% stenosis bilaterally.  Continue antiplatelet therapy as prescribed Continue management of CAD, HTN and Hyperlipidemia Healthy heart diet,  encouraged exercise at least 4 times per week Follow up in 18 months with duplex ultrasound and physical exam   - VAS US CAROTID; Future  2. Hyperlipidemia, unspecified  hyperlipidemia type Continue statin as ordered and reviewed, no changes at this time   3. GERD without esophagitis Continue PPI  as already ordered, this medication has been reviewed and there are no changes at this time.  Avoidence of caffeine and alcohol  Moderate elevation of the head of the bed    Hortencia Pilar, MD  10/11/2020 10:45 AM

## 2020-10-29 ENCOUNTER — Ambulatory Visit: Payer: Medicare Other

## 2020-11-12 ENCOUNTER — Other Ambulatory Visit: Payer: Self-pay | Admitting: Orthopedic Surgery

## 2020-11-12 DIAGNOSIS — N888 Other specified noninflammatory disorders of cervix uteri: Secondary | ICD-10-CM

## 2020-11-20 ENCOUNTER — Ambulatory Visit
Admission: RE | Admit: 2020-11-20 | Discharge: 2020-11-20 | Disposition: A | Discharge status: Home / Self Care | Payer: Medicare Other | Source: Ambulatory Visit | Attending: Orthopedic Surgery | Admitting: Orthopedic Surgery

## 2020-11-20 ENCOUNTER — Other Ambulatory Visit: Payer: Self-pay

## 2020-11-20 DIAGNOSIS — N888 Other specified noninflammatory disorders of cervix uteri: Secondary | ICD-10-CM | POA: Diagnosis present

## 2020-12-11 ENCOUNTER — Other Ambulatory Visit: Payer: Medicare Other

## 2020-12-11 DIAGNOSIS — Z20822 Contact with and (suspected) exposure to covid-19: Secondary | ICD-10-CM

## 2020-12-12 LAB — NOVEL CORONAVIRUS, NAA: SARS-CoV-2, NAA: NOT DETECTED

## 2020-12-12 LAB — SARS-COV-2, NAA 2 DAY TAT

## 2021-09-16 ENCOUNTER — Other Ambulatory Visit: Payer: Self-pay

## 2021-09-16 ENCOUNTER — Ambulatory Visit: Payer: Medicare Other | Admitting: Dermatology

## 2021-09-16 DIAGNOSIS — L820 Inflamed seborrheic keratosis: Secondary | ICD-10-CM

## 2021-09-16 DIAGNOSIS — Z85828 Personal history of other malignant neoplasm of skin: Secondary | ICD-10-CM

## 2021-09-16 DIAGNOSIS — Z1283 Encounter for screening for malignant neoplasm of skin: Secondary | ICD-10-CM | POA: Diagnosis not present

## 2021-09-16 DIAGNOSIS — L814 Other melanin hyperpigmentation: Secondary | ICD-10-CM

## 2021-09-16 DIAGNOSIS — M898X9 Other specified disorders of bone, unspecified site: Secondary | ICD-10-CM

## 2021-09-16 DIAGNOSIS — L720 Epidermal cyst: Secondary | ICD-10-CM | POA: Diagnosis not present

## 2021-09-16 DIAGNOSIS — M898X8 Other specified disorders of bone, other site: Secondary | ICD-10-CM | POA: Diagnosis not present

## 2021-09-16 DIAGNOSIS — L821 Other seborrheic keratosis: Secondary | ICD-10-CM

## 2021-09-16 DIAGNOSIS — L578 Other skin changes due to chronic exposure to nonionizing radiation: Secondary | ICD-10-CM

## 2021-09-16 DIAGNOSIS — D229 Melanocytic nevi, unspecified: Secondary | ICD-10-CM

## 2021-09-16 DIAGNOSIS — D1800 Hemangioma unspecified site: Secondary | ICD-10-CM

## 2021-09-16 NOTE — Patient Instructions (Addendum)

## 2021-09-16 NOTE — Progress Notes (Signed)
Follow-Up Visit   Subjective  Kathleen Fitzgerald is a 80 y.o. female who presents for the following: Annual Exam (Patient here for full body skin exam and skin cancer screening. Patient with hx of SCC. She is not aware of any new or changing spots. ).  Has new lump on post neck.  She was told it was a lipoma.  She has a couple of itchy irritated spots on leg and under arm.   The following portions of the chart were reviewed this encounter and updated as appropriate:       Review of Systems:  No other skin or systemic complaints except as noted in HPI or Assessment and Plan.  Objective  Well appearing patient in no apparent distress; mood and affect are within normal limits.  A full examination was performed including scalp, head, eyes, ears, nose, lips, neck, chest, axillae, abdomen, back, buttocks, bilateral upper extremities, bilateral lower extremities, hands, feet, fingers, toes, fingernails, and toenails. All findings within normal limits unless otherwise noted below.  Right Medial Thigh x 1, right posterior axilla x 1 (2) Erythematous keratotic or waxy stuck-on papule .   central upper lip at vermillion Firm flesh white papule 1.5 mm     Neck - Posterior Firm non-movable nodule at cervical spine   Assessment & Plan  Inflamed seborrheic keratosis Right Medial Thigh x 1, right posterior axilla x 1  Recheck right posterior axilla on follow up.   Destruction of lesion - Right Medial Thigh x 1, right posterior axilla x 1  Destruction method: cryotherapy   Informed consent: discussed and consent obtained   Lesion destroyed using liquid nitrogen: Yes   Region frozen until ice ball extended beyond lesion: Yes   Outcome: patient tolerated procedure well with no complications   Post-procedure details: wound care instructions given   Additional details:  Prior to procedure, discussed risks of blister formation, small wound, skin dyspigmentation, or rare scar following  cryotherapy. Recommend Vaseline ointment to treated areas while healing.   Milia central upper lip at vermillion  Vs SK vs Fibrous Papule  Benign-appearing, observation  Bony prominence Neck - Posterior  Doubt Lipoma, possible spinal protuberance  Recommend patient be evaluated by PCP, patient has appt in 2 weeks.   History of Squamous Cell Carcinoma of the Skin - No evidence of recurrence today - Recommend regular full body skin exams - Recommend daily broad spectrum sunscreen SPF 30+ to sun-exposed areas, reapply every 2 hours as needed.  - Call if any new or changing lesions are noted between office visits   Lentigines - Scattered tan macules - Due to sun exposure - Benign-appearing, observe - Recommend daily broad spectrum sunscreen SPF 30+ to sun-exposed areas, reapply every 2 hours as needed. - Call for any changes  Seborrheic Keratoses - Stuck-on, waxy, tan-brown papules and/or plaques, left lateral pretibial - Benign-appearing - Discussed benign etiology and prognosis. - Observe - Call for any changes  Melanocytic Nevi - Tan-brown and/or pink-flesh-colored symmetric macules and papules - Benign appearing on exam today - Observation - Call clinic for new or changing moles - Recommend daily use of broad spectrum spf 30+ sunscreen to sun-exposed areas.   Hemangiomas - Red papules - Discussed benign nature - Observe - Call for any changes  Actinic Damage - Chronic condition, secondary to cumulative UV/sun exposure - diffuse scaly erythematous macules with underlying dyspigmentation - Recommend daily broad spectrum sunscreen SPF 30+ to sun-exposed areas, reapply every 2 hours as needed.  -  Staying in the shade or wearing long sleeves, sun glasses (UVA+UVB protection) and wide brim hats (4-inch brim around the entire circumference of the hat) are also recommended for sun protection.  - Call for new or changing lesions.  Skin cancer screening performed  today.  Return in about 1 year (around 09/16/2022) for TBSE.  Graciella Belton, RMA, am acting as scribe for Brendolyn Patty, MD .  Documentation: I have reviewed the above documentation for accuracy and completeness, and I agree with the above.  Brendolyn Patty MD

## 2022-04-01 ENCOUNTER — Other Ambulatory Visit (INDEPENDENT_AMBULATORY_CARE_PROVIDER_SITE_OTHER): Payer: Self-pay | Admitting: Vascular Surgery

## 2022-04-01 DIAGNOSIS — I6523 Occlusion and stenosis of bilateral carotid arteries: Secondary | ICD-10-CM

## 2022-04-02 ENCOUNTER — Encounter (INDEPENDENT_AMBULATORY_CARE_PROVIDER_SITE_OTHER): Payer: Self-pay | Admitting: Vascular Surgery

## 2022-04-02 ENCOUNTER — Ambulatory Visit (INDEPENDENT_AMBULATORY_CARE_PROVIDER_SITE_OTHER): Payer: Medicare Other | Admitting: Vascular Surgery

## 2022-04-02 ENCOUNTER — Ambulatory Visit (INDEPENDENT_AMBULATORY_CARE_PROVIDER_SITE_OTHER): Payer: Medicare Other

## 2022-04-02 VITALS — BP 112/69 | HR 66 | Resp 16 | Ht 62.5 in | Wt 121.0 lb

## 2022-04-02 DIAGNOSIS — E785 Hyperlipidemia, unspecified: Secondary | ICD-10-CM

## 2022-04-02 DIAGNOSIS — I6523 Occlusion and stenosis of bilateral carotid arteries: Secondary | ICD-10-CM | POA: Diagnosis not present

## 2022-04-02 DIAGNOSIS — K219 Gastro-esophageal reflux disease without esophagitis: Secondary | ICD-10-CM | POA: Diagnosis not present

## 2022-04-05 ENCOUNTER — Encounter (INDEPENDENT_AMBULATORY_CARE_PROVIDER_SITE_OTHER): Payer: Self-pay | Admitting: Vascular Surgery

## 2022-04-05 NOTE — Progress Notes (Signed)
MRN : [id number]  Kathleen Fitzgerald is a 81 y.o. (09-21-1941) female who presents with chief complaint of check carotid arteries.  History of Present Illness:   The patient is seen for evaluation of carotid stenosis. The carotid stenosis is followed with semiannual duplex ultrasound.   The patient denies amaurosis fugax. There is no recent history of TIA symptoms or focal motor deficits. There is no prior documented CVA.   There is no history of migraine headaches. There is no history of seizures.   The patient is taking enteric-coated aspirin 81 mg daily.   The patient has a history of coronary artery disease, no recent episodes of angina or shortness of breath. The patient denies PAD or claudication symptoms. There is a history of hyperlipidemia which is being treated with a statin.    Duplex ultrasound is stable compared to the previous study.  The right ICA and the left ICA stenosis (<40%).  Bilateral vertebral arteries demonstrate antegrade flow.  Normal flow hemodynamics were seen in the by lateral subclavian arteries.  No change from the previous studies.    Current Meds  Medication Sig   aspirin EC 81 MG tablet Take 81 mg by mouth daily. Swallow whole.   Calcium Carb-Cholecalciferol (CALCIUM 600+D3 PO) Take 1 tablet by mouth 2 (two) times daily.   Cholecalciferol (VITAMIN D3) 50 MCG (2000 UT) TABS Take 2,000 Units by mouth daily with lunch.   FIBER ADULT GUMMIES PO Take 2 tablets by mouth daily. Fiber Well gummies   Multiple Vitamin (MULTIVITAMIN WITH MINERALS) TABS tablet Take 1 tablet by mouth daily.    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   L upper arm   Hyperlipidemia    IBS (irritable bowel syndrome)    Personal history of colonic polyps    Colonoscopy 04/12/13 - Repeat 5 years - polys   Sebaceous cyst 1975 and 2006   Scalp   Vertigo    Vitamin D deficiency     Past Surgical History:  Procedure Laterality Date   BREAST SURGERY Left 2000   biopsy - benign   bunion repair Right 12/1988   COLONOSCOPY     COLONOSCOPY WITH PROPOFOL N/A 04/24/2019   Procedure: COLONOSCOPY WITH PROPOFOL;  Surgeon: Manya Silvas, MD;  Location: [facility] ENDOSCOPY;  Service: Endoscopy;  Laterality: N/A;   CYST EXCISION     HEAD AND LEGS   ESOPHAGOGASTRODUODENOSCOPY (EGD) WITH PROPOFOL N/A 04/24/2019   Procedure: ESOPHAGOGASTRODUODENOSCOPY (EGD) WITH PROPOFOL;  Surgeon: Manya Silvas, MD;  Location: [facility] ENDOSCOPY;  Service: Endoscopy;  Laterality: N/A;   Fraser Right 04/12/2015   Procedure: Right hallux valgus correction, aiken osteotomy ;  Surgeon: Albertine Patricia, DPM;  Location: [facility] ORS;  Service: Podiatry;  Laterality: Right;  excisions exostosis 2nd toe   SUBMANDIBULAR GLAND EXCISION Left 10/17/2019   Procedure: EXCISION SUBMANDIBULAR GLAND;  Surgeon: Beverly Gust, MD;  Location: [facility] ORS;  Service: ENT;  Laterality: Left;    Social History Social History   Tobacco Use   Smoking status: Former  Packs/day: 2.00    Years: 33.00    Pack years: 66.00    Types: Cigarettes    Quit date: 11/03/1991    Years since quitting: 30.4   Smokeless tobacco: Never  Vaping Use   Vaping Use: Never used  Substance Use Topics   Alcohol use: Yes    Comment: wine - rare   Drug use: No    Family History Family History  Problem Relation Age of Onset   Breast cancer Mother    Hypertension Mother    Colon cancer Father    Alcohol abuse Father    Stroke Father    Breast cancer Sister    COPD Sister     Allergies  Allergen Reactions   Donnatal [Pb-Hyoscy-Atropine-Scopolamine] Other (See Comments)     Derealization feeling/"spaced out feeling"   Oxycodone Other (See Comments)    Dizziness/"out of my head"        REVIEW OF SYSTEMS (Negative unless checked)  Constitutional: '[]'$ Weight loss  '[]'$ Fever  '[]'$ Chills Cardiac: '[]'$ Chest pain   '[]'$ Chest pressure   '[]'$ Palpitations   '[]'$ Shortness of breath when laying flat   '[]'$ Shortness of breath with exertion. Vascular:  '[x]'$ Pain in legs with walking   '[]'$ Pain in legs at rest  '[]'$ History of DVT   '[]'$ Phlebitis   '[]'$ Swelling in legs   '[]'$ Varicose veins   '[]'$ Non-healing ulcers Pulmonary:   '[]'$ Uses home oxygen   '[]'$ Productive cough   '[]'$ Hemoptysis   '[]'$ Wheeze  '[]'$ COPD   '[]'$ Asthma Neurologic:  '[]'$ Dizziness   '[]'$ Seizures   '[]'$ History of stroke   '[]'$ History of TIA  '[]'$ Aphasia   '[]'$ Vissual changes   '[]'$ Weakness or numbness in arm   '[]'$ Weakness or numbness in leg Musculoskeletal:   '[]'$ Joint swelling   '[]'$ Joint pain   '[]'$ Low back pain Hematologic:  '[]'$ Easy bruising  '[]'$ Easy bleeding   '[]'$ Hypercoagulable state   '[]'$ Anemic Gastrointestinal:  '[]'$ Diarrhea   '[]'$ Vomiting  '[x]'$ Gastroesophageal reflux/heartburn   '[]'$ Difficulty swallowing. Genitourinary:  '[]'$ Chronic kidney disease   '[]'$ Difficult urination  '[]'$ Frequent urination   '[]'$ Blood in urine Skin:  '[]'$ Rashes   '[]'$ Ulcers  Psychological:  '[]'$ History of anxiety   '[]'$  History of major depression.  Physical Examination  Vitals:   04/02/22 1538  BP: 112/69  Pulse: 66  Resp: 16  Weight: 121 lb (54.9 kg)  Height: 5' 2.5" (1.588 m)   Body mass index is 21.78 kg/m. Gen: WD/WN, NAD Head: [place]/AT, No temporalis wasting.  Ear/Nose/Throat: Hearing grossly intact, nares w/o erythema or drainage Eyes: PER, EOMI, sclera nonicteric.  Neck: Supple, no masses.  No bruit or JVD.  Pulmonary:  Good air movement, no audible wheezing, no use of accessory muscles.  Cardiac: RRR, normal S1, S2, no Murmurs. Vascular:  carotid bruit noted Vessel Right Left  Radial Palpable Palpable  Carotid  Palpable  Palpable  Subclav  Palpable Palpable  Gastrointestinal: soft,  non-distended. No guarding/no peritoneal signs.  Musculoskeletal: M/S 5/5 throughout.  No visible deformity.  Neurologic: CN 2-12 intact. Pain and light touch intact in extremities.  Symmetrical.  Speech is fluent. Motor exam as listed above. Psychiatric: Judgment intact, Mood & affect appropriate for pt's clinical situation. Dermatologic: No rashes or ulcers noted.  No changes consistent with cellulitis.   CBC Lab Results  Component Value Date   WBC 7.0 04/03/2014   HGB 14.6 04/03/2014   HCT 44.3 04/03/2014   MCV 93.6 04/03/2014   PLT 311.0 04/03/2014    BMET    Component Value Date/Time   NA 138 04/03/2014 0835   K  4.2 04/03/2014 0835   CL 102 04/03/2014 0835   CO2 29 04/03/2014 0835   GLUCOSE 100 (H) 04/03/2014 0835   BUN 17 04/03/2014 0835   CREATININE 0.70 07/22/2018 1130   CALCIUM 9.4 04/03/2014 0835   CrCl cannot be calculated (Patient's most recent lab result is older than the maximum 21 days allowed.).  COAG No results found for: INR, PROTIME  Radiology VAS US CAROTID  Result Date: 04/02/2022 Carotid Arterial Duplex Study Patient Name:  Kathleen Fitzgerald  Date of Exam:   04/02/2022 Medical Rec #: [id number]      Accession #:    [id number] Date of Birth: 1941-09-25      Patient Gender: F Patient Age:   60 years Exam Location:  [facility] Procedure:      VAS US CAROTID Referring Phys: Hortencia Pilar --------------------------------------------------------------------------------  Performing Technologist: Concha Norway RVT  Examination Guidelines: A complete evaluation includes B-mode imaging, spectral Doppler, color Doppler, and power Doppler as needed of all accessible portions of each vessel. Bilateral testing is considered an integral part of a complete examination. Limited examinations for reoccurring indications may be performed as noted.  Right Carotid Findings: +----------+--------+--------+--------+------------------+--------+           PSV cm/sEDV  cm/sStenosisPlaque DescriptionComments +----------+--------+--------+--------+------------------+--------+ CCA Prox  77      16                                         +----------+--------+--------+--------+------------------+--------+ CCA Mid   90      16                                         +----------+--------+--------+--------+------------------+--------+ CCA Distal63      13                                         +----------+--------+--------+--------+------------------+--------+ ICA Prox  70      11                                         +----------+--------+--------+--------+------------------+--------+ ICA Mid   83      22                                         +----------+--------+--------+--------+------------------+--------+ ICA Distal76      14                                         +----------+--------+--------+--------+------------------+--------+ ECA       114     12                                         +----------+--------+--------+--------+------------------+--------+ +----------+--------+-------+----------------+-------------------+           PSV cm/sEDV cmsDescribe        Arm Pressure (  mmHG) +----------+--------+-------+----------------+-------------------+ [id number]            Multiphasic, WNL                    +----------+--------+-------+----------------+-------------------+ +---------+--------+--+--------+-+---------+ VertebralPSV cm/s34EDV cm/s9Antegrade +---------+--------+--+--------+-+---------+  Left Carotid Findings: +----------+--------+--------+--------+------------------+--------+           PSV cm/sEDV cm/sStenosisPlaque DescriptionComments +----------+--------+--------+--------+------------------+--------+ CCA Prox  72      17                                         +----------+--------+--------+--------+------------------+--------+ CCA Mid   92      26                                          +----------+--------+--------+--------+------------------+--------+ CCA Distal93      18                                         +----------+--------+--------+--------+------------------+--------+ ICA Prox  71      14                                         +----------+--------+--------+--------+------------------+--------+ ICA Mid   86      18                                         +----------+--------+--------+--------+------------------+--------+ ICA Distal89      19                                         +----------+--------+--------+--------+------------------+--------+ ECA       72      7                                          +----------+--------+--------+--------+------------------+--------+ +----------+--------+--------+---------+-------------------+           PSV cm/sEDV cm/sDescribe Arm Pressure (mmHG) +----------+--------+--------+---------+-------------------+ Subclavian218             Turbulent                    +----------+--------+--------+---------+-------------------+ +---------+--------+--+--------+-+---------+ VertebralPSV cm/s40EDV cm/s6Antegrade +---------+--------+--+--------+-+---------+   Summary: Right Carotid: The extracranial vessels were near-normal with only minimal wall                thickening or plaque. Left Carotid: The extracranial vessels were near-normal with only minimal wall               thickening or plaque. Vertebrals:  Bilateral vertebral arteries demonstrate antegrade flow. Subclavians: Left subclavian artery flow was disturbed. Normal flow hemodynamics              were seen in the right subclavian artery. *See table(s) above for measurements and observations.  Electronically signed by Hortencia Pilar MD on 04/02/2022 at 4:17:11 PM.    Final  Assessment/Plan 1. Bilateral carotid artery stenosis Recommend:  Given the patient's asymptomatic subcritical stenosis no further invasive testing or  surgery at this time.  Duplex ultrasound shows <50% stenosis bilaterally which has been unchanged when compared to the previous studies.  Continue antiplatelet therapy as prescribed Continue management of CAD, HTN and Hyperlipidemia Healthy heart diet,  encouraged exercise at least 4 times per week  Given the stable <50% bilateral carotid stenosis in association with the patient's age the patient will follow up PRN.  The patient is told that if symptoms of a TIA should occur then he should go to the ER and I should be notified, as this would change the management course.  The patient voices understanding.   2. GERD without esophagitis Continue PPI as already ordered, this medication has been reviewed and there are no changes at this time.  Avoidence of caffeine and alcohol  Moderate elevation of the head of the bed    3. Hyperlipidemia, unspecified hyperlipidemia type Continue statin as ordered and reviewed, no changes at this time      Hortencia Pilar, MD  04/05/2022 4:48 PM

## 2022-05-16 IMAGING — MR MR CERVICAL SPINE W/O CM
5 series · 35 of 48 positions shown · non-contrast
Comparison: Prior radiograph from 09/30/2020.

CLINICAL DATA: Initial evaluation for palpable lump at posterior
neck.

EXAM:
MRI CERVICAL SPINE WITHOUT CONTRAST
TECHNIQUE: Multiplanar, multisequence MR imaging of the cervical spine was
performed. No intravenous contrast was administered.

[Series 11: T2 · sagittal · 3.0mm · 0.62mm/px · 6 of 15 slices shown (1 of 2)]
[im 1/15]
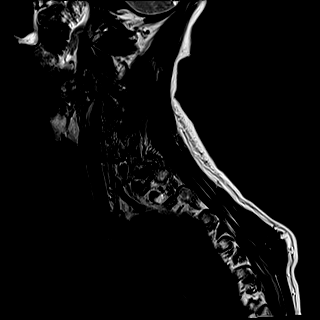
[im 3/15]
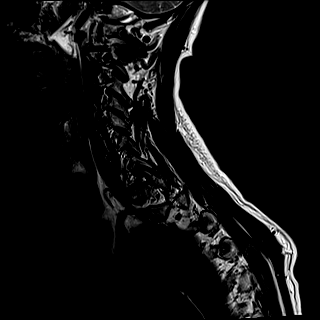
[im 6/15]
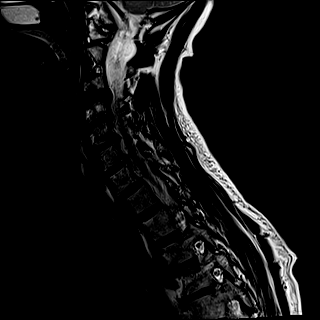
[im 9/15]
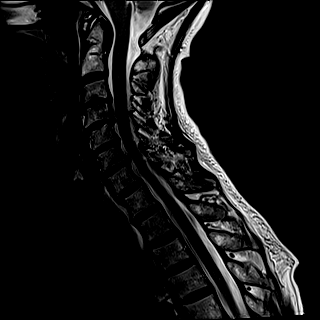
[im 12/15]
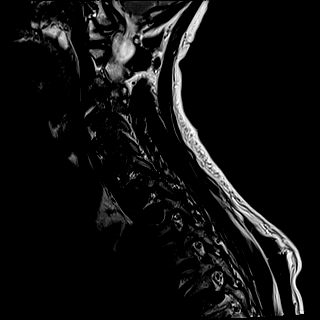
[im 15/15]
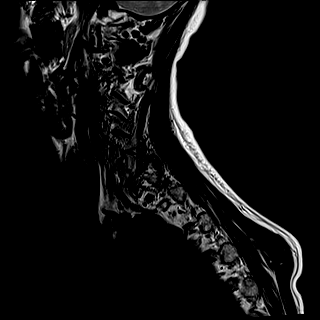

[Series 12: FLAIR · sagittal · 3.0mm · 0.78mm/px · 7 of 15 slices shown]
[im 1/15]
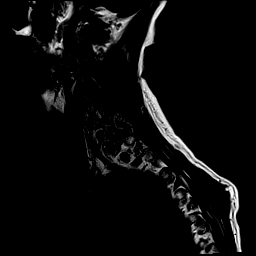
[im 3/15]
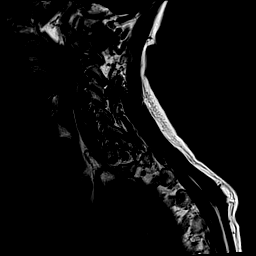
[im 5/15]
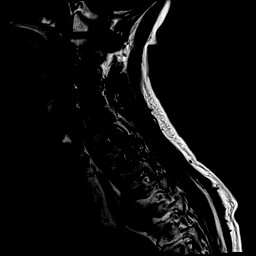
[im 8/15]
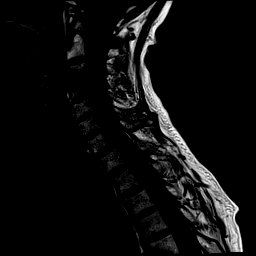
[im 10/15]
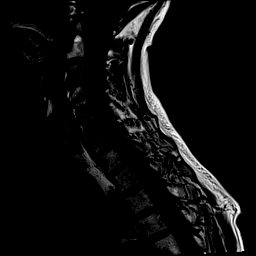
[im 12/15]
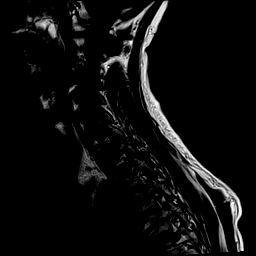
[im 15/15]
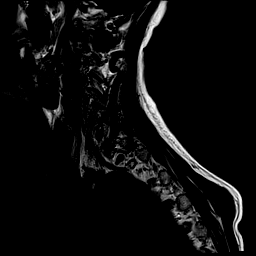

[Series 13: STIR · sagittal · 3.0mm · 0.62mm/px · 7 of 15 slices shown]
[im 1/15]
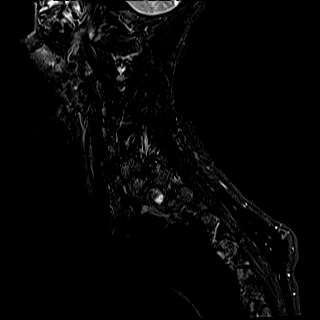
[im 3/15]
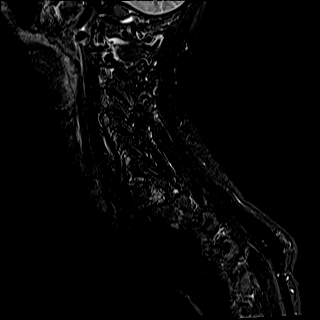
[im 5/15]
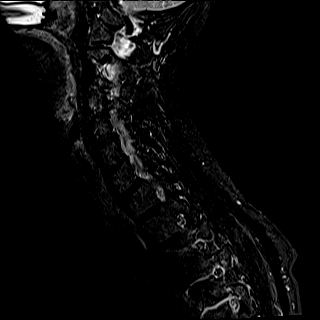
[im 8/15]
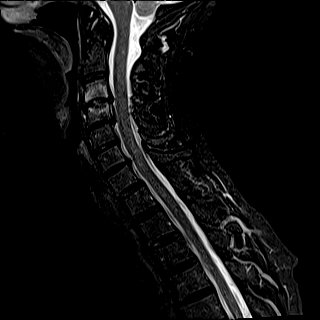
[im 10/15]
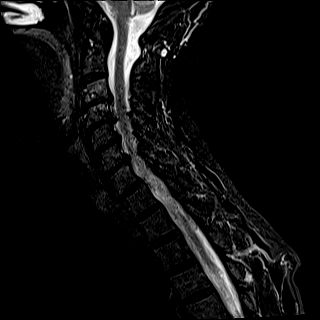
[im 12/15]
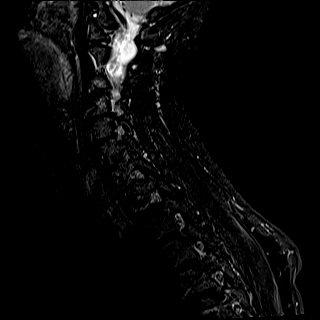
[im 15/15]
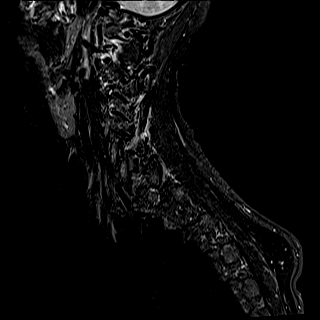

[Series 14: T2 · axial · 3.0mm · 0.70mm/px · z∈[+21,+123]mm · 8 of 30 slices shown (2 of 2)]
[im 1/30]
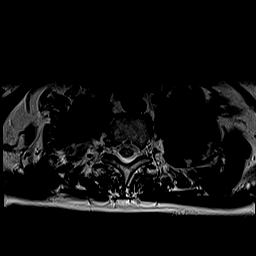
[im 5/30]
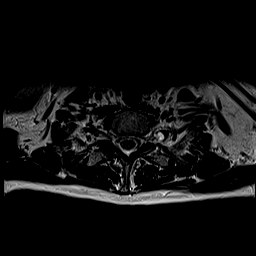
[im 9/30]
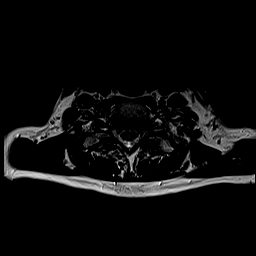
[im 14/30]
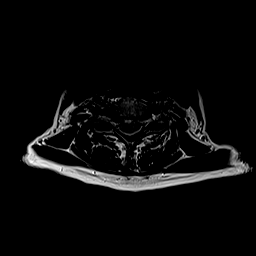
[im 16/30]
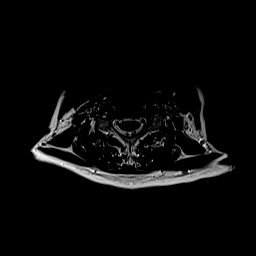
[im 21/30]
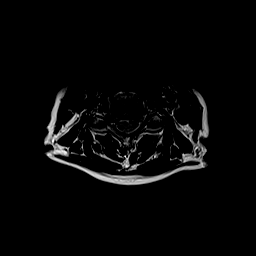
[im 25/30]
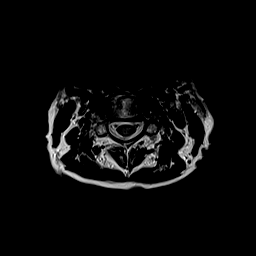
[im 30/30]
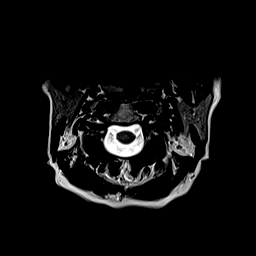

[Series 15: ax mpgr · axial · 3.0mm · 0.35mm/px · z∈[+21,+106]mm · 7 of 31 slices shown]
[im 1/31]
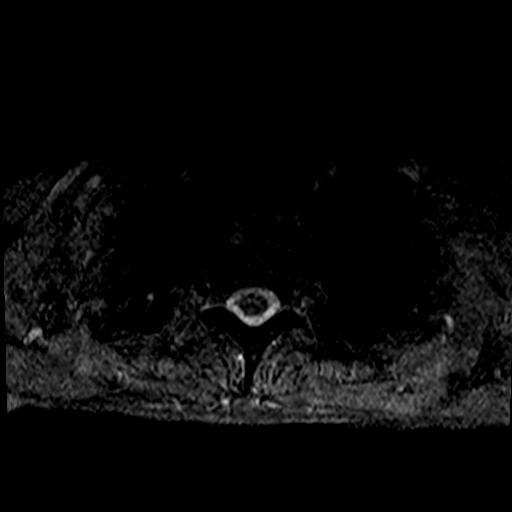
[im 5/31]
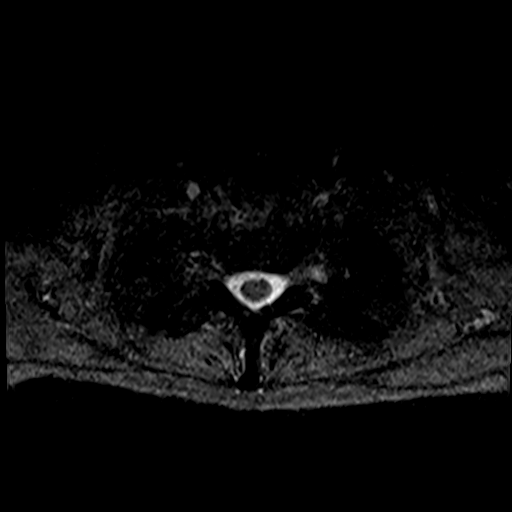
[im 10/31]
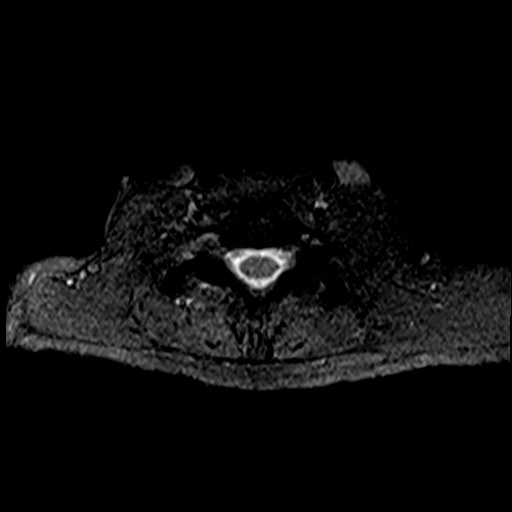
[im 14/31]
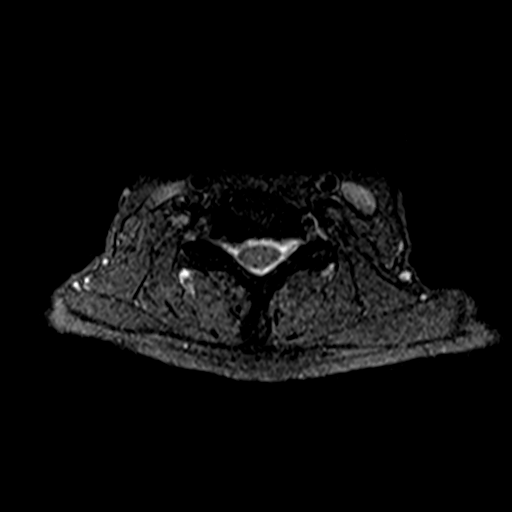
[im 17/31]
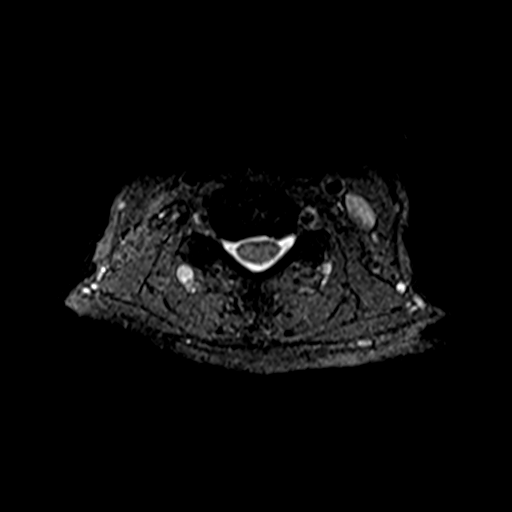
[im 21/31]
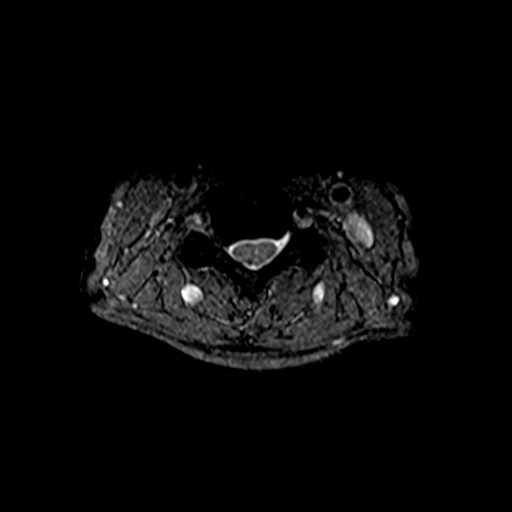
[im 26/31]
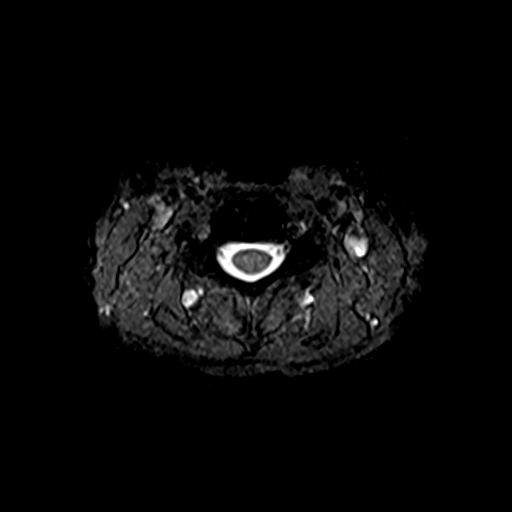

[35 of 48 positions shown; findings below may reference images not displayed]

FINDINGS: Alignment: Vertebral bodies normally aligned with preservation of
the normal cervical lordosis. Trace stepwise anterolisthesis of C7
on T1 through T2 on T3.

Vertebrae: Vertebral body height maintained without acute or chronic
fracture. Bone marrow signal intensity within normal limits. No
discrete or worrisome osseous lesions. Prominent discogenic reactive
endplate change with associated marrow edema present about the C3-4
interspace. No other abnormal marrow edema.

Cord: Normal signal and morphology.

Posterior Fossa, vertebral arteries, paraspinal tissues: Visualized
brain and posterior fossa within normal limits. Craniocervical
junction normal.

Vitamin-E marker overlies the upper posterior neck at a palpable
abnormality of concern, just to the left of midline. No underlying
soft tissue mass, collection, or other structural abnormality seen
at this location. Paraspinous and prevertebral soft tissues are
otherwise unremarkable. Normal flow voids seen within the vertebral
arteries bilaterally.

Disc levels:

C2-C3: Unremarkable.

C3-C4: Degenerative intervertebral disc space narrowing with diffuse
disc osteophyte complex. Prominent reactive endplate change with
associated marrow edema. Flattening and partial effacement of the
ventral thecal sac with resultant mild spinal stenosis. No cord
deformity. Foramina remain patent.

C4-C5: Degenerative intervertebral disc space narrowing with diffuse
disc osteophyte complex. Associated right greater than left
uncovertebral hypertrophy. Broad posterior component flattens and
partially faces the ventral thecal sac with resultant mild spinal
stenosis. No cord deformity. Severe right with mild left C5
foraminal stenosis.

C5-C6: Mild disc bulge with uncovertebral hypertrophy. Flattening of
the ventral thecal sac without significant spinal stenosis. Mild
left with mild-to-moderate right C6 foraminal narrowing.

C6-C7: Mild disc bulge with right greater than left uncovertebral
hypertrophy. Flattening of the ventral thecal sac with resultant
mild spinal stenosis. No cord deformity. Moderate right with mild
left C7 foraminal narrowing.

C7-T1: Mild disc bulge. Moderate left-sided facet degeneration. No
spinal stenosis. Mild left C8 foraminal narrowing.

Visualized upper thoracic spine demonstrates no significant finding.
IMPRESSION: 1. No soft tissue mass, collection, or other structural abnormality
seen at the posterior neck to correlate with patient's palpable
abnormality of concern.
2. Multilevel cervical spondylosis with resultant mild spinal
stenosis at C3-4, C4-5, and C6-7.
3. Multifactorial degenerative changes with resultant multilevel
foraminal narrowing as above. Notable findings include severe right
C5 foraminal stenosis, with moderate right C7 foraminal narrowing.
4. Prominent discogenic reactive endplate change with associated
marrow edema about the C3-4 interspace, which could serve as a
source for neck pain.

## 2022-09-22 ENCOUNTER — Ambulatory Visit: Payer: Medicare Other | Admitting: Dermatology

## 2022-09-22 DIAGNOSIS — W57XXXA Bitten or stung by nonvenomous insect and other nonvenomous arthropods, initial encounter: Secondary | ICD-10-CM

## 2022-09-22 DIAGNOSIS — S50861A Insect bite (nonvenomous) of right forearm, initial encounter: Secondary | ICD-10-CM | POA: Diagnosis not present

## 2022-09-22 DIAGNOSIS — L820 Inflamed seborrheic keratosis: Secondary | ICD-10-CM | POA: Diagnosis not present

## 2022-09-22 DIAGNOSIS — Z1283 Encounter for screening for malignant neoplasm of skin: Secondary | ICD-10-CM

## 2022-09-22 DIAGNOSIS — D692 Other nonthrombocytopenic purpura: Secondary | ICD-10-CM

## 2022-09-22 DIAGNOSIS — L821 Other seborrheic keratosis: Secondary | ICD-10-CM

## 2022-09-22 DIAGNOSIS — Z85828 Personal history of other malignant neoplasm of skin: Secondary | ICD-10-CM

## 2022-09-22 DIAGNOSIS — L814 Other melanin hyperpigmentation: Secondary | ICD-10-CM

## 2022-09-22 DIAGNOSIS — D229 Melanocytic nevi, unspecified: Secondary | ICD-10-CM

## 2022-09-22 DIAGNOSIS — L578 Other skin changes due to chronic exposure to nonionizing radiation: Secondary | ICD-10-CM

## 2022-09-22 NOTE — Progress Notes (Signed)
Follow-Up Visit   Subjective  Kathleen Fitzgerald is a 81 y.o. female who presents for the following: Annual Exam (1 yr tbse. Patient reports spot at left breast, right knee , some spot at upper lip that she picks at. /).  The patient presents for Total-Body Skin Exam (TBSE) for skin cancer screening and mole check.  The patient has spots, moles and lesions to be evaluated, some may be new or changing and the patient has concerns that these could be cancer.   The following portions of the chart were reviewed this encounter and updated as appropriate:      Review of Systems: No other skin or systemic complaints except as noted in HPI or Assessment and Plan.   Objective  Well appearing patient in no apparent distress; mood and affect are within normal limits.  A full examination was performed including scalp, head, eyes, ears, nose, lips, neck, chest, axillae, abdomen, back, buttocks, bilateral upper extremities, bilateral lower extremities, hands, feet, fingers, toes, fingernails, and toenails. All findings within normal limits unless otherwise noted below.  left thumb nail Purple black patch at proximal L thumbnail         central upper lip at vermillion x 1, left nasolabial medial fold x 1 (2) Erythematous stuck-on, waxy papule  right upper arm Pink excoriated papule    Assessment & Plan  Purpura ([place]) left thumb nail  From prior trauma  Benign. Observe.  Will recheck at next follow up  Discussed can take 4 - 6 months to grow out completely  Inflamed seborrheic keratosis (2) central upper lip at vermillion x 1, left nasolabial medial fold x 1  Symptomatic, irritating, patient would like treated.  Destruction of lesion - central upper lip at vermillion x 1, left nasolabial medial fold x 1  Destruction method: cryotherapy   Informed consent: discussed and consent obtained   Lesion destroyed using liquid nitrogen: Yes   Region frozen until ice ball extended  beyond lesion: Yes   Outcome: patient tolerated procedure well with no complications   Post-procedure details: wound care instructions given   Additional details:  Prior to procedure, discussed risks of blister formation, small wound, skin dyspigmentation, or rare scar following cryotherapy. Recommend Vaseline ointment to treated areas while healing.   Bug bite without infection, initial encounter right upper arm  Benign. Observe.   Apply Eucrisa a bid qd- sample given   Recheck at next follow up if not resolved.   Lentigines - Scattered tan macules - Due to sun exposure - Benign-appearing, observe - Recommend daily broad spectrum sunscreen SPF 30+ to sun-exposed areas, reapply every 2 hours as needed. - Call for any changes  Seborrheic Keratoses At right knee  - Stuck-on, waxy, tan-brown papules and/or plaques  - Benign-appearing - Discussed benign etiology and prognosis. - Observe - Call for any changes  Melanocytic Nevi - Tan-brown and/or pink-flesh-colored symmetric macules and papules - Benign appearing on exam today - Observation - Call clinic for new or changing moles - Recommend daily use of broad spectrum spf 30+ sunscreen to sun-exposed areas.   Hemangiomas - Red papules - Discussed benign nature - Observe - Call for any changes  Actinic Damage - Chronic condition, secondary to cumulative UV/sun exposure - diffuse scaly erythematous macules with underlying dyspigmentation - Recommend daily broad spectrum sunscreen SPF 30+ to sun-exposed areas, reapply every 2 hours as needed.  - Staying in the shade or wearing long sleeves, sun glasses (UVA+UVB protection) and wide brim hats (  4-inch brim around the entire circumference of the hat) are also recommended for sun protection.  - Call for new or changing lesions.  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  - Call if any new or changing lesions are noted between office visits  Skin cancer screening performed today. Return for 3 month follow up on left thumb nail, 1 year tbse. I, Ruthell Rummage, CMA, am acting as scribe for Brendolyn Patty, MD.  Documentation: I have reviewed the above documentation for accuracy and completeness, and I agree with the above.  Brendolyn Patty MD

## 2022-09-22 NOTE — Patient Instructions (Addendum)
Seborrheic Keratosis  What causes seborrheic keratoses? Seborrheic keratoses are harmless, common skin growths that first appear during adult life.  As time goes by, more growths appear.  Some people may develop a large number of them.  Seborrheic keratoses appear on both covered and uncovered body parts.  They are not caused by sunlight.  The tendency to develop seborrheic keratoses can be inherited.  They vary in color from skin-colored to gray, brown, or even black.  They can be either smooth or have a rough, warty surface.   Seborrheic keratoses are superficial and look as if they were stuck on the skin.  Under the microscope this type of keratosis looks like layers upon layers of skin.  That is why at times the top layer may seem to fall off, but the rest of the growth remains and re-grows.    Treatment Seborrheic keratoses do not need to be treated, but can easily be removed in the office.  Seborrheic keratoses often cause symptoms when they rub on clothing or jewelry.  Lesions can be in the way of shaving.  If they become inflamed, they can cause itching, soreness, or burning.  Removal of a seborrheic keratosis can be accomplished by freezing, burning, or surgery. If any spot bleeds, scabs, or grows rapidly, please return to have it checked, as these can be an indication of a skin cancer.  Cryotherapy Aftercare  Wash gently with soap and water everyday.   Apply Vaseline and Band-Aid daily until healed.       Melanoma ABCDEs  Melanoma is the most dangerous type of skin cancer, and is the leading cause of death from skin disease.  You are more likely to develop melanoma if you: Have light-colored skin, light-colored eyes, or red or blond hair Spend a lot of time in the sun Tan regularly, either outdoors or in a tanning bed Have had blistering sunburns, especially during childhood Have a close family member who has had a melanoma Have atypical moles or large birthmarks  Early  detection of melanoma is key since treatment is typically straightforward and cure rates are extremely high if we catch it early.   The first sign of melanoma is often a change in a mole or a new dark spot.  The ABCDE system is a way of remembering the signs of melanoma.  A for asymmetry:  The two halves do not match. B for border:  The edges of the growth are irregular. C for color:  A mixture of colors are present instead of an even brown color. D for diameter:  Melanomas are usually (but not always) greater than 6mm - the size of a pencil eraser. E for evolution:  The spot keeps changing in size, shape, and color.  Please check your skin once per month between visits. You can use a small mirror in front and a large mirror behind you to keep an eye on the back side or your body.   If you see any new or changing lesions before your next follow-up, please call to schedule a visit.  Please continue daily skin protection including broad spectrum sunscreen SPF 30+ to sun-exposed areas, reapplying every 2 hours as needed when you're outdoors.   Staying in the shade or wearing long sleeves, sun glasses (UVA+UVB protection) and wide brim hats (4-inch brim around the entire circumference of the hat) are also recommended for sun protection.     Due to recent changes in healthcare laws, you may see results of   your pathology and/or laboratory studies on MyChart before the doctors have had a chance to review them. We understand that in some cases there may be results that are confusing or concerning to you. Please understand that not all results are received at the same time and often the doctors may need to interpret multiple results in order to provide you with the best plan of care or course of treatment. Therefore, we ask that you please give us 2 business days to thoroughly review all your results before contacting the office for clarification. Should we see a critical lab result, you will be contacted  sooner.   If You Need Anything After Your Visit  If you have any questions or concerns for your doctor, please call our main line at [phone] and press option 4 to reach your doctor's medical assistant. If no one answers, please leave a voicemail as directed and we will return your call as soon as possible. Messages left after 4 pm will be answered the following business day.   You may also send us a message via MyChart. We typically respond to MyChart messages within 1-2 business days.  For prescription refills, please ask your pharmacy to contact our office. Our fax number is [phone].  If you have an urgent issue when the clinic is closed that cannot wait until the next business day, you can page your doctor at the number below.    Please note that while we do our best to be available for urgent issues outside of office hours, we are not available 24/7.   If you have an urgent issue and are unable to reach us, you may choose to seek medical care at your doctor's office, retail clinic, urgent care center, or emergency room.  If you have a medical emergency, please immediately call 911 or go to the emergency department.  Pager Numbers  - Dr. Kowalski: [phone]  - Dr. Moye: [phone]  - Dr. Stewart: [phone]  In the event of inclement weather, please call our main line at [phone] for an update on the status of any delays or closures.  Dermatology Medication Tips: Please keep the boxes that topical medications come in in order to help keep track of the instructions about where and how to use these. Pharmacies typically print the medication instructions only on the boxes and not directly on the medication tubes.   If your medication is too expensive, please contact our office at [phone] option 4 or send us a message through MyChart.   We are unable to tell what your co-pay for medications will be in advance as this is different depending on your insurance  coverage. However, we may be able to find a substitute medication at lower cost or fill out paperwork to get insurance to cover a needed medication.   If a prior authorization is required to get your medication covered by your insurance company, please allow us 1-2 business days to complete this process.  Drug prices often vary depending on where the prescription is filled and some pharmacies may offer cheaper prices.  The website www.goodrx.com contains coupons for medications through different pharmacies. The prices here do not account for what the cost may be with help from insurance (it may be cheaper with your insurance), but the website can give you the price if you did not use any insurance.  - You can print the associated coupon and take it with your prescription to the pharmacy.  - You may also stop   by our office during regular business hours and pick up a GoodRx coupon card.  - If you need your prescription sent electronically to a different pharmacy, notify our office through [facility] MyChart or by phone at [phone] option 4.     Si Usted Necesita Algo Despus de Su Visita  Tambin puede enviarnos un mensaje a travs de MyChart. Por lo general respondemos a los mensajes de MyChart en el transcurso de 1 a 2 das hbiles.  Para renovar recetas, por favor pida a su farmacia que se ponga en contacto con nuestra oficina. Nuestro nmero de fax es el [phone].  Si tiene un asunto urgente cuando la clnica est cerrada y que no puede esperar hasta el siguiente da hbil, puede llamar/localizar a su doctor(a) al nmero que aparece a continuacin.   Por favor, tenga en cuenta que aunque hacemos todo lo posible para estar disponibles para asuntos urgentes fuera del horario de oficina, no estamos disponibles las 24 horas del da, los 7 das de la semana.   Si tiene un problema urgente y no puede comunicarse con nosotros, puede optar por buscar atencin mdica  en el consultorio de  su doctor(a), en una clnica privada, en un centro de atencin urgente o en una sala de emergencias.  Si tiene una emergencia mdica, por favor llame inmediatamente al 911 o vaya a la sala de emergencias.  Nmeros de bper  - Dr. Kowalski: [phone]  - Dra. Moye: [phone]  - Dra. Stewart: [phone]  En caso de inclemencias del tiempo, por favor llame a nuestra lnea principal al [phone] para una actualizacin sobre el estado de cualquier retraso o cierre.  Consejos para la medicacin en dermatologa: Por favor, guarde las cajas en las que vienen los medicamentos de uso tpico para ayudarle a seguir las instrucciones sobre dnde y cmo usarlos. Las farmacias generalmente imprimen las instrucciones del medicamento slo en las cajas y no directamente en los tubos del medicamento.   Si su medicamento es muy caro, por favor, pngase en contacto con nuestra oficina llamando al [phone] y presione la opcin 4 o envenos un mensaje a travs de MyChart.   No podemos decirle cul ser su copago por los medicamentos por adelantado ya que esto es diferente dependiendo de la cobertura de su seguro. Sin embargo, es posible que podamos encontrar un medicamento sustituto a menor costo o llenar un formulario para que el seguro cubra el medicamento que se considera necesario.   Si se requiere una autorizacin previa para que su compaa de seguros cubra su medicamento, por favor permtanos de 1 a 2 das hbiles para completar este proceso.  Los precios de los medicamentos varan con frecuencia dependiendo del lugar de dnde se surte la receta y alguna farmacias pueden ofrecer precios ms baratos.  El sitio web www.goodrx.com tiene cupones para medicamentos de diferentes farmacias. Los precios aqu no tienen en cuenta lo que podra costar con la ayuda del seguro (puede ser ms barato con su seguro), pero el sitio web puede darle el precio si no utiliz ningn seguro.  - Puede imprimir el  cupn correspondiente y llevarlo con su receta a la farmacia.  - Tambin puede pasar por nuestra oficina durante el horario de atencin regular y recoger una tarjeta de cupones de GoodRx.  - Si necesita que su receta se enve electrnicamente a una farmacia diferente, informe a nuestra oficina a travs de MyChart de [place] o por telfono llamando al [phone] y presione la opcin 4.  

## 2022-12-22 ENCOUNTER — Encounter: Payer: Self-pay | Admitting: Dermatology

## 2022-12-22 ENCOUNTER — Ambulatory Visit: Payer: Medicare Other | Admitting: Dermatology

## 2022-12-22 VITALS — BP 117/65 | HR 72

## 2022-12-22 DIAGNOSIS — D692 Other nonthrombocytopenic purpura: Secondary | ICD-10-CM | POA: Diagnosis not present

## 2022-12-22 DIAGNOSIS — L578 Other skin changes due to chronic exposure to nonionizing radiation: Secondary | ICD-10-CM

## 2022-12-22 NOTE — Patient Instructions (Addendum)
Due to recent changes in healthcare laws, you may see results of your pathology and/or laboratory studies on MyChart before the doctors have had a chance to review them. We understand that in some cases there may be results that are confusing or concerning to you. Please understand that not all results are received at the same time and often the doctors may need to interpret multiple results in order to provide you with the best plan of care or course of treatment. Therefore, we ask that you please give us 2 business days to thoroughly review all your results before contacting the office for clarification. Should we see a critical lab result, you will be contacted sooner.   If You Need Anything After Your Visit  If you have any questions or concerns for your doctor, please call our main line at [phone] and press option 4 to reach your doctor's medical assistant. If no one answers, please leave a voicemail as directed and we will return your call as soon as possible. Messages left after 4 pm will be answered the following business day.   You may also send us a message via MyChart. We typically respond to MyChart messages within 1-2 business days.  For prescription refills, please ask your pharmacy to contact our office. Our fax number is [phone].  If you have an urgent issue when the clinic is closed that cannot wait until the next business day, you can page your doctor at the number below.    Please note that while we do our best to be available for urgent issues outside of office hours, we are not available 24/7.   If you have an urgent issue and are unable to reach us, you may choose to seek medical care at your doctor's office, retail clinic, urgent care center, or emergency room.  If you have a medical emergency, please immediately call 911 or go to the emergency department.  Pager Numbers  - Dr. Kowalski: [phone]  - Dr. Moye: [phone]  - Dr. Stewart:  [phone]  In the event of inclement weather, please call our main line at [phone] for an update on the status of any delays or closures.  Dermatology Medication Tips: Please keep the boxes that topical medications come in in order to help keep track of the instructions about where and how to use these. Pharmacies typically print the medication instructions only on the boxes and not directly on the medication tubes.   If your medication is too expensive, please contact our office at [phone] option 4 or send us a message through MyChart.   We are unable to tell what your co-pay for medications will be in advance as this is different depending on your insurance coverage. However, we may be able to find a substitute medication at lower cost or fill out paperwork to get insurance to cover a needed medication.   If a prior authorization is required to get your medication covered by your insurance company, please allow us 1-2 business days to complete this process.  Drug prices often vary depending on where the prescription is filled and some pharmacies may offer cheaper prices.  The website www.goodrx.com contains coupons for medications through different pharmacies. The prices here do not account for what the cost may be with help from insurance (it may be cheaper with your insurance), but the website can give you the price if you did not use any insurance.  - You can print the associated coupon and take it with   your prescription to the pharmacy.  - You may also stop by our office during regular business hours and pick up a GoodRx coupon card.  - If you need your prescription sent electronically to a different pharmacy, notify our office through [facility] MyChart or by phone at [phone] option 4.     Si Usted Necesita Algo Despus de Su Visita  Tambin puede enviarnos un mensaje a travs de MyChart. Por lo general respondemos a los mensajes de MyChart en el transcurso de 1 a 2  das hbiles.  Para renovar recetas, por favor pida a su farmacia que se ponga en contacto con nuestra oficina. Nuestro nmero de fax es el [phone].  Si tiene un asunto urgente cuando la clnica est cerrada y que no puede esperar hasta el siguiente da hbil, puede llamar/localizar a su doctor(a) al nmero que aparece a continuacin.   Por favor, tenga en cuenta que aunque hacemos todo lo posible para estar disponibles para asuntos urgentes fuera del horario de oficina, no estamos disponibles las 24 horas del da, los 7 das de la semana.   Si tiene un problema urgente y no puede comunicarse con nosotros, puede optar por buscar atencin mdica  en el consultorio de su doctor(a), en una clnica privada, en un centro de atencin urgente o en una sala de emergencias.  Si tiene una emergencia mdica, por favor llame inmediatamente al 911 o vaya a la sala de emergencias.  Nmeros de bper  - Dr. Kowalski: [phone]  - Dra. Moye: [phone]  - Dra. Stewart: [phone]  En caso de inclemencias del tiempo, por favor llame a nuestra lnea principal al [phone] para una actualizacin sobre el estado de cualquier retraso o cierre.  Consejos para la medicacin en dermatologa: Por favor, guarde las cajas en las que vienen los medicamentos de uso tpico para ayudarle a seguir las instrucciones sobre dnde y cmo usarlos. Las farmacias generalmente imprimen las instrucciones del medicamento slo en las cajas y no directamente en los tubos del medicamento.   Si su medicamento es muy caro, por favor, pngase en contacto con nuestra oficina llamando al [phone] y presione la opcin 4 o envenos un mensaje a travs de MyChart.   No podemos decirle cul ser su copago por los medicamentos por adelantado ya que esto es diferente dependiendo de la cobertura de su seguro. Sin embargo, es posible que podamos encontrar un medicamento sustituto a menor costo o llenar un formulario para que el  seguro cubra el medicamento que se considera necesario.   Si se requiere una autorizacin previa para que su compaa de seguros cubra su medicamento, por favor permtanos de 1 a 2 das hbiles para completar este proceso.  Los precios de los medicamentos varan con frecuencia dependiendo del lugar de dnde se surte la receta y alguna farmacias pueden ofrecer precios ms baratos.  El sitio web www.goodrx.com tiene cupones para medicamentos de diferentes farmacias. Los precios aqu no tienen en cuenta lo que podra costar con la ayuda del seguro (puede ser ms barato con su seguro), pero el sitio web puede darle el precio si no utiliz ningn seguro.  - Puede imprimir el cupn correspondiente y llevarlo con su receta a la farmacia.  - Tambin puede pasar por nuestra oficina durante el horario de atencin regular y recoger una tarjeta de cupones de GoodRx.  - Si necesita que su receta se enve electrnicamente a una farmacia diferente, informe a nuestra oficina a travs de MyChart de [facility]   o por telfono llamando al [phone] y presione la opcin 4.      Due to recent changes in healthcare laws, you may see results of your pathology and/or laboratory studies on MyChart before the doctors have had a chance to review them. We understand that in some cases there may be results that are confusing or concerning to you. Please understand that not all results are received at the same time and often the doctors may need to interpret multiple results in order to provide you with the best plan of care or course of treatment. Therefore, we ask that you please give [place] 2 business days to thoroughly review all your results before contacting the office for clarification. Should we see a critical lab result, you will be contacted sooner.   If You Need Anything After Your Visit  If you have any questions or concerns for your doctor, please call our main line at [phone] and press option 4 to reach your  doctor's medical assistant. If no one answers, please leave a voicemail as directed and we will return your call as soon as possible. Messages left after 4 pm will be answered the following business day.   You may also send [place] a message via [facility]. We typically respond to MyChart messages within 1-2 business days.  For prescription refills, please ask your pharmacy to contact our office. Our fax number is [phone].  If you have an urgent issue when the clinic is closed that cannot wait until the next business day, you can page your doctor at the number below.    Please note that while we do our best to be available for urgent issues outside of office hours, we are not available 24/7.   If you have an urgent issue and are unable to reach [place], you may choose to seek medical care at your doctor's office, retail clinic, urgent care center, or emergency room.  If you have a medical emergency, please immediately call 911 or go to the emergency department.  Pager Numbers  - Dr. Nehemiah Massed: [phone]  - Dr. Laurence Ferrari: [phone]  - Dr. Nicole Kindred: [phone]  In the event of inclement weather, please call our main line at [phone] for an update on the status of any delays or closures.  Dermatology Medication Tips: Please keep the boxes that topical medications come in in order to help keep track of the instructions about where and how to use these. Pharmacies typically print the medication instructions only on the boxes and not directly on the medication tubes.   If your medication is too expensive, please contact our office at [phone] option 4 or send [place] a message through [facility].   We are unable to tell what your co-pay for medications will be in advance as this is different depending on your insurance coverage. However, we may be able to find a substitute medication at lower cost or fill out paperwork to get insurance to cover a needed medication.   If a prior authorization is  required to get your medication covered by your insurance company, please allow [place] 1-2 business days to complete this process.  Drug prices often vary depending on where the prescription is filled and some pharmacies may offer cheaper prices.  The website www.goodrx.com contains coupons for medications through different pharmacies. The prices here do not account for what the cost may be with help from insurance (it may be cheaper with your insurance), but the website can give you the price if you  did not use any insurance.  - You can print the associated coupon and take it with your prescription to the pharmacy.  - You may also stop by our office during regular business hours and pick up a GoodRx coupon card.  - If you need your prescription sent electronically to a different pharmacy, notify our office through [facility] or by phone at [phone] option 4.     Si Usted Necesita Algo Despus de Su Visita  Tambin puede enviarnos un mensaje a travs de Pharmacist, community. Por lo general respondemos a los mensajes de MyChart en el transcurso de 1 a 2 das hbiles.  Para renovar recetas, por favor pida a su farmacia que se ponga en contacto con nuestra oficina. Harland Dingwall de fax es [place] [phone].  Si tiene un asunto urgente cuando la clnica est cerrada y que no puede esperar hasta el siguiente da hbil, puede llamar/localizar a su doctor(a) al nmero que aparece a continuacin.   Por favor, tenga en cuenta que aunque hacemos todo lo posible para estar disponibles para asuntos urgentes fuera del horario de [place], no estamos disponibles las 24 horas del da, los 7 das de la [place].   Si tiene un problema urgente y no puede comunicarse con nosotros, puede optar por buscar atencin mdica  en el consultorio de su doctor(a), en una clnica privada, en un centro de atencin urgente o en una sala de emergencias.  Si tiene Engineering geologist, por favor llame inmediatamente al 911 o vaya a  la sala de emergencias.  Nmeros de bper  - Dr. Nehemiah Massed: [phone]  - Dra. Moye: [phone]  - Dra. Nicole Kindred: [phone]  En caso de inclemencias del [place], por favor llame a Johnsie Kindred principal al [phone] para una actualizacin sobre el [place] de cualquier retraso o cierre.  Consejos para la medicacin en dermatologa: Por favor, guarde las cajas en las que vienen los medicamentos de uso tpico para ayudarle a seguir las instrucciones sobre dnde y cmo usarlos. Las farmacias generalmente imprimen las instrucciones del medicamento slo en las cajas y no directamente en los tubos del [place].   Si su medicamento es muy caro, por favor, pngase en contacto con Zigmund Daniel llamando al [phone] y presione la opcin 4 o envenos un mensaje a travs de Pharmacist, community.   No podemos decirle cul ser su copago por los medicamentos por adelantado ya que esto es diferente dependiendo de la cobertura de su seguro. Sin embargo, es posible que podamos encontrar un medicamento sustituto a Electrical engineer un formulario para que el seguro cubra el medicamento que se considera necesario.   Si se requiere una autorizacin previa para que su compaa de seguros [place] su medicamento, por favor permtanos de 1 a 2 das hbiles para completar este proceso.  Los precios de los medicamentos varan con frecuencia dependiendo del Environmental consultant de dnde se surte la receta y alguna farmacias pueden ofrecer precios ms baratos.  El sitio web www.goodrx.com tiene cupones para medicamentos de Airline pilot. Los precios aqu no tienen en cuenta lo que podra costar con la ayuda del seguro (puede ser ms barato con su seguro), pero el sitio web puede darle el precio si no utiliz Research scientist (physical sciences).  - Puede imprimir el cupn correspondiente y llevarlo con su receta a la farmacia.  - Tambin puede pasar por nuestra oficina durante el horario de atencin regular y Charity fundraiser una tarjeta de cupones de  GoodRx.  - Si necesita que su receta se  enve electrnicamente a una farmacia diferente, informe a nuestra oficina a travs de MyChart de [place] o por telfono llamando al [phone] y presione la opcin 4.

## 2022-12-22 NOTE — Progress Notes (Signed)
   Follow-Up Visit   Subjective  Kathleen Fitzgerald is a 82 y.o. female who presents for the following: purpura (Patient here today for 3 month recheck on left thumb nail. She reports area has improved since last visit).  The patient has spots, moles and lesions to be evaluated, some may be new or changing and the patient has concerns that these could be cancer.   The following portions of the chart were reviewed this encounter and updated as appropriate:      Review of Systems: No other skin or systemic complaints except as noted in HPI or Assessment and Plan.   Objective  Well appearing patient in no apparent distress; mood and affect are within normal limits.  A focused examination was performed including left thumb nail. Relevant physical exam findings are noted in the Assessment and Plan.  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 Observe.   Discussed can take 4 - 6 months to grow out completely.  If nail matrix was damaged, could be permanent dystrophy   Actinic Damage - chronic, secondary to cumulative UV radiation exposure/sun exposure over time - diffuse scaly erythematous macules with underlying dyspigmentation - Recommend daily broad spectrum sunscreen SPF 30+ to sun-exposed areas, reapply every 2 hours as needed.  - Recommend staying in the shade or wearing long sleeves, sun glasses (UVA+UVB protection) and wide brim hats (4-inch brim around the entire circumference of the hat). - Call for new or changing lesions.   Return for keep follow up as schedule 09/21/23.  I, Ruthell Rummage, CMA, am acting as scribe for Brendolyn Patty, MD.  Documentation: I have reviewed the above documentation for accuracy and completeness, and I agree with the  above.  Brendolyn Patty MD

## 2023-03-16 ENCOUNTER — Ambulatory Visit: Payer: Medicare Other | Admitting: Urology

## 2023-09-21 ENCOUNTER — Encounter: Payer: Self-pay | Admitting: Dermatology

## 2023-09-21 ENCOUNTER — Ambulatory Visit: Payer: Medicare Other | Admitting: Dermatology

## 2023-09-21 DIAGNOSIS — Z1283 Encounter for screening for malignant neoplasm of skin: Secondary | ICD-10-CM | POA: Diagnosis not present

## 2023-09-21 DIAGNOSIS — L820 Inflamed seborrheic keratosis: Secondary | ICD-10-CM

## 2023-09-21 DIAGNOSIS — D229 Melanocytic nevi, unspecified: Secondary | ICD-10-CM

## 2023-09-21 DIAGNOSIS — W908XXA Exposure to other nonionizing radiation, initial encounter: Secondary | ICD-10-CM

## 2023-09-21 DIAGNOSIS — L720 Epidermal cyst: Secondary | ICD-10-CM

## 2023-09-21 DIAGNOSIS — D1801 Hemangioma of skin and subcutaneous tissue: Secondary | ICD-10-CM

## 2023-09-21 DIAGNOSIS — Z85828 Personal history of other malignant neoplasm of skin: Secondary | ICD-10-CM

## 2023-09-21 DIAGNOSIS — L821 Other seborrheic keratosis: Secondary | ICD-10-CM

## 2023-09-21 DIAGNOSIS — L814 Other melanin hyperpigmentation: Secondary | ICD-10-CM

## 2023-09-21 DIAGNOSIS — L578 Other skin changes due to chronic exposure to nonionizing radiation: Secondary | ICD-10-CM | POA: Diagnosis not present

## 2023-09-21 NOTE — Progress Notes (Signed)
Follow-Up Visit   Subjective  Kathleen Fitzgerald is a 82 y.o. female who presents for the following: Skin Cancer Screening and Full Body Skin Exam  The patient presents for Total-Body Skin Exam (TBSE) for skin cancer screening and mole check. The patient has spots, moles and lesions to be evaluated, some may be new or changing, including right neck, left shoulder, right leg, and left knee. She picks at some of these areas. History of SCC.   The following portions of the chart were reviewed this encounter and updated as appropriate: medications, allergies, medical history  Review of Systems:  No other skin or systemic complaints except as noted in HPI or Assessment and Plan.  Objective  Well appearing patient in no apparent distress; mood and affect are within normal limits.  A full examination was performed including scalp, head, eyes, ears, nose, lips, neck, chest, axillae, abdomen, back, buttocks, bilateral upper extremities, bilateral lower extremities, hands, feet, fingers, toes, fingernails, and toenails. All findings within normal limits unless otherwise noted below.   Relevant physical exam findings are noted in the Assessment and Plan.  R ant shoulder x 1, R popliteal x 1, L med knee x 1, L shoulder x 1 (4) Erythematous stuck-on, waxy papule or plaque    Assessment & Plan   SKIN CANCER SCREENING PERFORMED TODAY.  ACTINIC DAMAGE - Chronic condition, secondary to cumulative UV/sun exposure - diffuse scaly erythematous macules with underlying dyspigmentation - Recommend daily broad spectrum sunscreen SPF 30+ to sun-exposed areas, reapply every 2 hours as needed.  - Staying in the shade or wearing long sleeves, sun glasses (UVA+UVB protection) and wide brim hats (4-inch brim around the entire circumference of the hat) are also recommended for sun protection.  - Call for new or changing lesions.  LENTIGINES, HEMANGIOMAS - Benign normal skin lesions - Benign-appearing - Call for  any changes  SEBORRHEIC KERATOSIS - Stuck-on, waxy, tan-brown papules and/or plaques - Benign-appearing - Discussed benign etiology and prognosis. - Observe - Call for any changes  MELANOCYTIC NEVI - Tan-brown and/or pink-flesh-colored symmetric macules and papules - Benign appearing on exam today - Observation - Call clinic for new or changing moles - Recommend daily use of broad spectrum spf 30+ sunscreen to sun-exposed areas.   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  - Call if any new or changing lesions are noted between office visits  Milia - 2.0 mm firm flesh papule at central upper lip vermilion - type of cyst - benign - sometimes these will clear with nightly OTC adapalene/Differin 0.1% gel or retinol. Sample of Effaclar 0.1% gel given.  - may be extracted if symptomatic - observe  Inflamed seborrheic keratosis (4) R ant shoulder x 1, R popliteal x 1, L med knee x 1, L shoulder x 1  Symptomatic, irritating, patient would like treated.  Destruction of lesion - R ant shoulder x 1, R popliteal x 1, L med knee x 1, L shoulder x 1 (4)  Destruction method: cryotherapy   Informed consent: discussed and consent obtained   Lesion destroyed using liquid nitrogen: Yes   Region frozen until ice ball extended beyond lesion: Yes   Outcome: patient tolerated procedure well with no complications   Post-procedure details: wound care instructions given   Additional details:  Prior to procedure, discussed risks of blister formation, small wound, skin dyspigmentation, or rare scar following cryotherapy. Recommend Vaseline ointment to treated areas while healing.    Return in about 1 year (around 09/20/2024) for TBSE, Hx SCC.  ICherlyn Labella, CMA, am acting as scribe for Willeen Niece, MD .   Documentation: I have reviewed the above documentation for accuracy and completeness, and I agree with the above.  Willeen Niece, MD

## 2023-09-21 NOTE — Patient Instructions (Addendum)
Cryotherapy Aftercare  Wash gently with soap and water everyday.   Apply Vaseline and Band-Aid daily until healed.   Seborrheic Keratosis  What causes seborrheic keratoses? Seborrheic keratoses are harmless, common skin growths that first appear during adult life.  As time goes by, more growths appear.  Some people may develop a large number of them.  Seborrheic keratoses appear on both covered and uncovered body parts.  They are not caused by sunlight.  The tendency to develop seborrheic keratoses can be inherited.  They vary in color from skin-colored to gray, brown, or even black.  They can be either smooth or have a rough, warty surface.   Seborrheic keratoses are superficial and look as if they were stuck on the skin.  Under the microscope this type of keratosis looks like layers upon layers of skin.  That is why at times the top layer may seem to fall off, but the rest of the growth remains and re-grows.    Treatment Seborrheic keratoses do not need to be treated, but can easily be removed in the office.  Seborrheic keratoses often cause symptoms when they rub on clothing or jewelry.  Lesions can be in the way of shaving.  If they become inflamed, they can cause itching, soreness, or burning.  Removal of a seborrheic keratosis can be accomplished by freezing, burning, or surgery. If any spot bleeds, scabs, or grows rapidly, please return to have it checked, as these can be an indication of a skin cancer.  Due to recent changes in healthcare laws, you may see results of your pathology and/or laboratory studies on MyChart before the doctors have had a chance to review them. We understand that in some cases there may be results that are confusing or concerning to you. Please understand that not all results are received at the same time and often the doctors may need to interpret multiple results in order to provide you with the best plan of care or course of treatment. Therefore, we ask that you  please give [place] 2 business days to thoroughly review all your results before contacting the office for clarification. Should we see a critical lab result, you will be contacted sooner.   If You Need Anything After Your Visit  If you have any questions or concerns for your doctor, please call our main line at [phone] and press option 4 to reach your doctor's medical assistant. If no one answers, please leave a voicemail as directed and we will return your call as soon as possible. Messages left after 4 pm will be answered the following business day.   You may also send [place] a message via MyChart. We typically respond to MyChart messages within 1-2 business days.  For prescription refills, please ask your pharmacy to contact our office. Our fax number is [phone].  If you have an urgent issue when the clinic is closed that cannot wait until the next business day, you can page your doctor at the number below.    Please note that while we do our best to be available for urgent issues outside of office hours, we are not available 24/7.   If you have an urgent issue and are unable to reach [place], you may choose to seek medical care at your doctor's office, retail clinic, urgent care center, or emergency room.  If you have a medical emergency, please immediately call 911 or go to the emergency department.  Pager Numbers  - Dr. Gwen Pounds: [phone]  -  Dr. Roseanne Reno: [phone]  - Dr. Katrinka Blazing: [phone]   In the event of inclement weather, please call our main line at [phone] for an update on the status of any delays or closures.  Dermatology Medication Tips: Please keep the boxes that topical medications come in in order to help keep track of the instructions about where and how to use these. Pharmacies typically print the medication instructions only on the boxes and not directly on the medication tubes.   If your medication is too expensive, please contact our office at  [phone] option 4 or send [place] a message through MyChart.   We are unable to tell what your co-pay for medications will be in advance as this is different depending on your insurance coverage. However, we may be able to find a substitute medication at lower cost or fill out paperwork to get insurance to cover a needed medication.   If a prior authorization is required to get your medication covered by your insurance company, please allow [place] 1-2 business days to complete this process.  Drug prices often vary depending on where the prescription is filled and some pharmacies may offer cheaper prices.  The website www.goodrx.com contains coupons for medications through different pharmacies. The prices here do not account for what the cost may be with help from insurance (it may be cheaper with your insurance), but the website can give you the price if you did not use any insurance.  - You can print the associated coupon and take it with your prescription to the pharmacy.  - You may also stop by our office during regular business hours and pick up a GoodRx coupon card.  - If you need your prescription sent electronically to a different pharmacy, notify our office through [facility] or by phone at [phone] option 4.     Si Usted Necesita Algo Despus de Su Visita  Tambin puede enviarnos un mensaje a travs de Clinical cytogeneticist. Por lo general respondemos a los mensajes de MyChart en el transcurso de 1 a 2 das hbiles.  Para renovar recetas, por favor pida a su farmacia que se ponga en contacto con nuestra oficina. Annie Sable de fax es [place] [phone].  Si tiene un asunto urgente cuando la clnica est cerrada y que no puede esperar hasta el siguiente da hbil, puede llamar/localizar a su doctor(a) al nmero que aparece a continuacin.   Por favor, tenga en cuenta que aunque hacemos todo lo posible para estar disponibles para asuntos urgentes fuera del horario de [place], no estamos  disponibles las 24 horas del da, los 7 [address]  [address] de la [place].   Si tiene un problema urgente y no puede comunicarse con nosotros, puede optar por buscar atencin mdica  en el consultorio de su doctor(a), en una clnica privada, en un centro de atencin urgente o en una sala de emergencias.  Si tiene Engineer, drilling, por favor llame inmediatamente al 911 o vaya a la sala de emergencias.  Nmeros de bper  - Dr. Gwen Pounds: [phone]  - Dra. Roseanne Reno: [phone]  - Dr. Katrinka Blazing: [phone]   En caso de inclemencias del tiempo, por favor llame a Lacy Duverney principal al [phone] para una actualizacin sobre el [place] de cualquier retraso o cierre.  Consejos para la medicacin en dermatologa: Por favor, guarde las cajas en las que vienen los medicamentos de uso tpico para ayudarle a seguir las instrucciones sobre dnde y cmo usarlos. Las farmacias generalmente imprimen las instrucciones del medicamento slo en las  cajas y no directamente en los tubos del medicamento.   Si su medicamento es muy caro, por favor, pngase en contacto con Rolm Gala llamando al [phone] y presione la opcin 4 o envenos un mensaje a travs de Clinical cytogeneticist.   No podemos decirle cul ser su copago por los medicamentos por adelantado ya que esto es diferente dependiendo de la cobertura de su seguro. Sin embargo, es posible que podamos encontrar un medicamento sustituto a Audiological scientist un formulario para que el seguro cubra el medicamento que se considera necesario.   Si se requiere una autorizacin previa para que su compaa de seguros [place] su medicamento, por favor permtanos de 1 a 2 das hbiles para completar [address]  Los precios de los medicamentos varan con frecuencia dependiendo del Environmental consultant de dnde se surte la receta y alguna farmacias pueden ofrecer precios ms baratos.  El sitio web www.goodrx.com tiene cupones para medicamentos de Health and safety inspector. Los precios aqu no  tienen en cuenta lo que podra costar con la ayuda del seguro (puede ser ms barato con su seguro), pero el sitio web puede darle el precio si no utiliz Tourist information centre manager.  - Puede imprimir el cupn correspondiente y llevarlo con su receta a la farmacia.  - Tambin puede pasar por nuestra oficina durante el horario de atencin regular y Education officer, museum una tarjeta de cupones de GoodRx.  - Si necesita que su receta se enve electrnicamente a una farmacia diferente, informe a nuestra oficina a travs de MyChart de [place] o por telfono llamando al [phone] y presione la opcin 4.

## 2024-05-01 ENCOUNTER — Encounter: Payer: Self-pay | Admitting: Ophthalmology

## 2024-05-08 NOTE — Discharge Instructions (Signed)

## 2024-05-22 ENCOUNTER — Ambulatory Visit: Payer: Self-pay | Admitting: Anesthesiology

## 2024-05-22 ENCOUNTER — Encounter: Payer: Self-pay | Admitting: Ophthalmology

## 2024-05-22 ENCOUNTER — Ambulatory Visit
Admission: RE | Admit: 2024-05-22 | Discharge: 2024-05-22 | Disposition: A | Discharge status: Home / Self Care | Attending: Ophthalmology | Admitting: Ophthalmology

## 2024-05-22 ENCOUNTER — Other Ambulatory Visit: Payer: Self-pay

## 2024-05-22 ENCOUNTER — Encounter
Admission: RE | Disposition: A | Discharge status: Home / Self Care | Payer: Self-pay | Source: Home / Self Care | Attending: Ophthalmology

## 2024-05-22 DIAGNOSIS — Z87891 Personal history of nicotine dependence: Secondary | ICD-10-CM | POA: Insufficient documentation

## 2024-05-22 DIAGNOSIS — H2512 Age-related nuclear cataract, left eye: Secondary | ICD-10-CM | POA: Diagnosis present

## 2024-05-22 HISTORY — PX: CATARACT EXTRACTION W/PHACO: SHX586

## 2024-05-22 SURGERY — PHACOEMULSIFICATION, CATARACT, WITH IOL INSERTION
Anesthesia: Monitor Anesthesia Care | Site: Eye | Laterality: Left

## 2024-05-22 MED ORDER — MIDAZOLAM HCL 2 MG/2ML IJ SOLN
INTRAMUSCULAR | Status: DC | PRN
  Administered 2024-05-22: .5 mg via INTRAVENOUS
  Administered 2024-05-22: 1 mg via INTRAVENOUS

## 2024-05-22 MED ORDER — LACTATED RINGERS IV SOLN: INTRAVENOUS | Status: DC

## 2024-05-22 MED ORDER — ARMC OPHTHALMIC DILATING DROPS
1.0000 | OPHTHALMIC | Status: DC | PRN
  Administered 2024-05-22 (×3): 1 via OPHTHALMIC

## 2024-05-22 MED ORDER — SIGHTPATH DOSE#1 BSS IO SOLN
INTRAOCULAR | Status: DC | PRN
  Administered 2024-05-22: 15 mL via INTRAOCULAR

## 2024-05-22 MED ORDER — FENTANYL CITRATE (PF) 100 MCG/2ML IJ SOLN
INTRAMUSCULAR | Status: AC
Start: 2024-05-22 — End: 2024-05-22
  Filled 2024-05-22: qty 2

## 2024-05-22 MED ORDER — MIDAZOLAM HCL 2 MG/2ML IJ SOLN
1.0000 mg | INTRAMUSCULAR | Status: DC | PRN
  Administered 2024-05-22: 1 mg via INTRAVENOUS

## 2024-05-22 MED ORDER — SODIUM CHLORIDE 0.9% FLUSH
INTRAVENOUS | Status: DC | PRN
  Administered 2024-05-22 (×2): 10 mL via INTRAVENOUS

## 2024-05-22 MED ORDER — TETRACAINE HCL 0.5 % OP SOLN
1.0000 [drp] | OPHTHALMIC | Status: DC | PRN
  Administered 2024-05-22 (×3): 1 [drp] via OPHTHALMIC

## 2024-05-22 MED ORDER — MOXIFLOXACIN HCL 0.5 % OP SOLN
OPHTHALMIC | Status: DC | PRN
  Administered 2024-05-22: .2 mL via OPHTHALMIC

## 2024-05-22 MED ORDER — FENTANYL CITRATE (PF) 100 MCG/2ML IJ SOLN
INTRAMUSCULAR | Status: DC | PRN
  Administered 2024-05-22 (×3): 25 ug via INTRAVENOUS

## 2024-05-22 MED ORDER — ARMC OPHTHALMIC DILATING DROPS
OPHTHALMIC | Status: AC
  Filled 2024-05-22: qty 0.5

## 2024-05-22 MED ORDER — SIGHTPATH DOSE#1 NA HYALUR & NA CHOND-NA HYALUR IO KIT
PACK | INTRAOCULAR | Status: DC | PRN
  Administered 2024-05-22: 1 via OPHTHALMIC

## 2024-05-22 MED ORDER — ONDANSETRON HCL 4 MG PO TABS
4.0000 mg | ORAL_TABLET | Freq: Once | ORAL | Status: AC
  Administered 2024-05-22: 4 mg via ORAL

## 2024-05-22 MED ORDER — MIDAZOLAM HCL 2 MG/2ML IJ SOLN
INTRAMUSCULAR | Status: AC
  Filled 2024-05-22: qty 2

## 2024-05-22 MED ORDER — ONDANSETRON 4 MG PO TBDP
ORAL_TABLET | ORAL | Status: AC
  Filled 2024-05-22: qty 1

## 2024-05-22 MED ORDER — EPINEPHRINE PF 1 MG/ML IJ SOLN
INTRAMUSCULAR | Status: DC | PRN
  Administered 2024-05-22: 98 mL via OPHTHALMIC

## 2024-05-22 MED ORDER — TETRACAINE HCL 0.5 % OP SOLN
OPHTHALMIC | Status: AC
Start: 2024-05-22 — End: 2024-05-22
  Filled 2024-05-22: qty 4

## 2024-05-22 MED ORDER — LIDOCAINE HCL (PF) 2 % IJ SOLN
INTRAMUSCULAR | Status: DC | PRN
  Administered 2024-05-22: 4 mL via INTRAOCULAR

## 2024-05-22 SURGICAL SUPPLY — 10 items
CATARACT SUITE SIGHTPATH (MISCELLANEOUS) ×1 IMPLANT
DISSECTOR HYDRO NUCLEUS 50X22 (MISCELLANEOUS) ×1 IMPLANT
FEE CATARACT SUITE SIGHTPATH (MISCELLANEOUS) ×1 IMPLANT
GLOVE PI ULTRA LF STRL 7.5 (GLOVE) ×1 IMPLANT
GLOVE SURG SYN 6.5 PF PI BL (GLOVE) ×1 IMPLANT
GLOVE SURG SYN 8.5 PF PI BL (GLOVE) ×1 IMPLANT
LENS IOL CLRN PAN 25.0 IMPLANT
NDL FILTER BLUNT 18X1 1/2 (NEEDLE) ×1 IMPLANT
NEEDLE FILTER BLUNT 18X1 1/2 (NEEDLE) ×1 IMPLANT
SYR 3ML LL SCALE MARK (SYRINGE) ×1 IMPLANT

## 2024-05-22 NOTE — H&P (Signed)
 [facility]   Primary Care Physician:  Epifanio Alm SQUIBB, MD Ophthalmologist: Dr. Adine Novak  Pre-Procedure History & Physical: HPI:  Kathleen Fitzgerald is a 83 y.o. female here for cataract surgery.   Past Medical History:  Diagnosis Date   BPPV (benign paroxysmal positional vertigo)    2 episodes   Carotid stenosis    Chronic cough 2004   Has been evaluated with no etiology   Chronic kidney disease    KIDNEY STONES   GERD (gastroesophageal reflux disease)    History of chicken pox    childhood   History of hiatal hernia    Hx of squamous cell carcinoma of skin 09/19/2018   L upper arm   Hyperlipidemia    IBS (irritable bowel syndrome)    Personal history of colonic polyps    Colonoscopy 04/12/13 - Repeat 5 years - polys   Sebaceous cyst 1975 and 2006   Scalp   Squamous cell carcinoma of skin    L upper arm   Vertigo    Vitamin D  deficiency     Past Surgical History:  Procedure Laterality Date   BREAST SURGERY Left 2000   biopsy - benign   bunion repair Right 12/1988   COLONOSCOPY     COLONOSCOPY WITH PROPOFOL  N/A 04/24/2019   Procedure: COLONOSCOPY WITH PROPOFOL ;  Surgeon: Viktoria Lamar DASEN, MD;  [facility] ENDOSCOPY;  Service: Endoscopy;  Laterality: N/A;   CYST EXCISION     HEAD AND LEGS   ESOPHAGOGASTRODUODENOSCOPY (EGD) WITH PROPOFOL  N/A 04/24/2019   Procedure: ESOPHAGOGASTRODUODENOSCOPY (EGD) WITH PROPOFOL ;  Surgeon: Viktoria Lamar DASEN, MD;  Location: [facility] ENDOSCOPY;  Service: Endoscopy;  Laterality: N/A;   HALLUX VALGUS AUSTIN Right 04/12/2015   Procedure: Right hallux valgus correction, aiken osteotomy ;  Surgeon: Donnice Cory, DPM;  Location: [facility] ORS;  Service: Podiatry;  Laterality: Right;  excisions exostosis 2nd toe   SUBMANDIBULAR GLAND EXCISION Left 10/17/2019   Procedure: EXCISION SUBMANDIBULAR GLAND;  Surgeon: Herminio Miu, MD;  Location: [facility] ORS;  Service: ENT;  Laterality: Left;    Prior to Admission medications   Medication Sig  Start Date End Date Taking? Authorizing Provider  aspirin EC 81 MG tablet Take 81 mg by mouth daily. Swallow whole.   Yes [provider]  Calcium Carb-Cholecalciferol (CALCIUM 600+D3 PO) Take 1 tablet by mouth 2 (two) times daily.   Yes [provider]  Cholecalciferol (VITAMIN D3) 50 MCG (2000 UT) TABS Take 2,000 Units by mouth daily with lunch.   Yes [provider]  FIBER ADULT GUMMIES PO Take 2 tablets by mouth daily. Fiber Well gummies   Yes [provider]  Multiple Vitamin (MULTIVITAMIN WITH MINERALS) TABS tablet Take 1 tablet by mouth daily.   Yes [provider]  Alpha-Lipoic Acid 600 MG CAPS Take by mouth. Patient not taking: Reported on 05/01/2024    [provider]    Allergies as of 05/01/2024 - Review Complete 05/01/2024  Allergen Reaction Noted   Donnatal [pb-hyoscy-atropine-scopolamine] Other (See Comments) 04/11/2015   Oxycodone Other (See Comments) 05/21/2015    Family History  Problem Relation Age of Onset   Breast cancer Mother    Hypertension Mother    Colon cancer Father    Alcohol abuse Father    Stroke Father    Breast cancer Sister    COPD Sister     Social History   Socioeconomic History   Marital status: Single    Spouse name: Not on file  Number of children: 0   Years of education: 13   Highest education level: Not on file  Occupational History   Occupation: Investment banker, corporate Work    Comment: Retired  Tobacco Use   Smoking status: Former    Current packs/day: 0.00    Average packs/day: 2.0 packs/day for 33.0 years (66.0 ttl pk-yrs)    Types: Cigarettes    Start date: 11/02/1958    Quit date: 11/03/1991    Years since quitting: 32.5   Smokeless tobacco: Never  Vaping Use   Vaping status: Never Used  Substance and Sexual Activity   Alcohol use: Yes    Comment: wine - rare   Drug use: No   Sexual activity: Not Currently  Other Topics Concern   Not on file  Social History Narrative   Hansini  grew up in [place] . She was living in [place] for 1 year. She recently moved to the [place] area. She lives alone. Never married. No children. She has two cats [facility] & [place]). She enjoys gardening.       Exercise - not at present   Caffeine - 1 cup of coffee daily   Diet - follows healthy diet - fruits, vegetables, lean protein, Almond milk, water   Social Drivers of Corporate investment banker Strain: Low Risk  (12/27/2023)   [facility]   Overall Financial Resource Strain (CARDIA)    Difficulty of Paying Living Expenses: Not hard at all  Food Insecurity: No Food Insecurity (12/27/2023)   Received from [facility]   Hunger Vital Sign    Within the past 12 months, you worried that your food would run out before you got the money to buy more.: Never true    Within the past 12 months, the food you bought just didn't last and you didn't have money to get more.: Never true  Transportation Needs: No Transportation Needs (12/27/2023)   Received from [facility] - Transportation    In the past 12 months, has lack of transportation kept you from medical appointments or from getting medications?: No    Lack of Transportation (Non-Medical): No  Physical Activity: Sufficiently Active (08/16/2017)   Received from [facility]   Exercise Vital Sign    Days of Exercise per Week: 5 days    Minutes of Exercise per Session: 60 min  Stress: No Stress Concern Present (08/16/2017)   Received from [facility] - Occupational Stress Questionnaire    Feeling of Stress : Only a little  Social Connections: Moderately Integrated (08/16/2017)   [facility]   Social Connection and Isolation Panel    Frequency of Communication with Friends and Family: Three times a week    Frequency of Social Gatherings with Friends and  Family: Three times a week    Attends Religious Services: 1 to 4 times per year    Active Member of Clubs or Organizations: Yes    Attends Engineer, structural: More than 4 times per year    Marital Status: Never married  Catering manager Violence: Not on file    Review of Systems: See HPI, otherwise negative ROS  Physical Exam: BP (!) 154/66   Pulse 86   Temp 97.8 F (36.6 C) (Temporal)   Resp 20   Ht 5' 2 (1.575 m)   Wt 53.1 kg   SpO2  96%   BMI 21.40 kg/m  General:   Alert, cooperative. Head:  Normocephalic and atraumatic. Respiratory:  Normal work of breathing. Cardiovascular:  NAD  Impression/Plan: Rebekah Zackery is here for cataract surgery.  Risks, benefits, limitations, and alternatives regarding cataract surgery have been reviewed with the patient.  Questions have been answered.  All parties agreeable.   Adine Novak, MD  05/22/2024, 1:28 PM

## 2024-05-22 NOTE — Anesthesia Postprocedure Evaluation (Signed)
 Anesthesia Post Note  Patient: Susannah Carbin  Procedure(s) Performed: PHACOEMULSIFICATION, CATARACT, WITH IOL INSERTION 6.76 00:45.8 (Left: Eye)  Patient location during evaluation: PACU Anesthesia Type: MAC Level of consciousness: awake and alert Pain management: pain level controlled Vital Signs Assessment: post-procedure vital signs reviewed and stable Respiratory status: spontaneous breathing, nonlabored ventilation, respiratory function stable and patient connected to nasal cannula oxygen Cardiovascular status: stable and blood pressure returned to baseline Postop Assessment: no apparent nausea or vomiting Anesthetic complications: no   No notable events documented.   Last Vitals:  Vitals:   05/22/24 1405 05/22/24 1410  BP: 131/66 115/85  Pulse: 66 (!) 36  Resp: 15   Temp:    SpO2: 94% 99%    Last Pain:  Vitals:   05/22/24 1404  TempSrc:   PainSc: 0-No pain                 Hunt Zajicek C Shyniece Scripter

## 2024-05-22 NOTE — Op Note (Signed)
 OPERATIVE NOTE  Kathleen Fitzgerald [id number] 05/22/2024   PREOPERATIVE DIAGNOSIS:  Nuclear sclerotic cataract left eye.  H25.12   POSTOPERATIVE DIAGNOSIS:    Nuclear sclerotic cataract left eye.     PROCEDURE:  Phacoemusification with posterior chamber intraocular lens placement of the left eye   LENS:   Implant Name Type Inv. Item Serial No. Manufacturer Lot No. LRB No. Used Action  LENS IOL CLRN PAN 25.0 - [id number]  LENS IOL CLRN PAN 25.0 [id number] [employer]  Left 1 Implanted      Procedure(s): PHACOEMULSIFICATION, CATARACT, WITH IOL INSERTION 6.76 00:45.8 (Left)  SURGEON:  Adine Novak, MD, MPH   ANESTHESIA:  Topical with tetracaine  drops augmented with 1% preservative-free intracameral lidocaine .  ESTIMATED BLOOD LOSS: <1 mL   COMPLICATIONS:  None.   DESCRIPTION OF PROCEDURE:  The patient was identified in the holding room and transported to the operating room and placed in the supine position under the operating microscope.  The left eye was identified as the operative eye and it was prepped and draped in the usual sterile ophthalmic fashion.   A 1.0 millimeter clear-corneal paracentesis was made at the 5:00 position. 0.5 ml of preservative-free 1% lidocaine  with epinephrine  was injected into the anterior chamber.  The anterior chamber was filled with viscoelastic.  A 2.4 millimeter keratome was used to make a near-clear corneal incision at the 2:00 position.  A curvilinear capsulorrhexis was made with a cystotome and capsulorrhexis forceps.  Balanced salt solution was used to hydrodissect and hydrodelineate the nucleus.   Phacoemulsification was then used in stop and chop fashion to remove the lens nucleus and epinucleus.  The remaining cortex was then removed using the irrigation and aspiration handpiece. Viscoelastic was then placed into the capsular bag to distend it for lens placement.  A lens was then injected into the capsular bag.  The remaining viscoelastic was  aspirated.   Wounds were hydrated with balanced salt solution.  The anterior chamber was inflated to a physiologic pressure with balanced salt solution.  Intracameral vigamox  0.1 mL undiltued was injected into the eye and a drop placed onto the ocular surface.  No wound leaks were noted.  The patient was taken to the recovery room in stable condition without complications of anesthesia or surgery  Adine Novak 05/22/2024, 1:58 PM

## 2024-05-22 NOTE — Transfer of Care (Signed)
 Immediate Anesthesia Transfer of Care Note  Patient: Kathleen Fitzgerald  Procedure(s) Performed: PHACOEMULSIFICATION, CATARACT, WITH IOL INSERTION 6.76 00:45.8 (Left: Eye)  Patient Location: PACU  Anesthesia Type:MAC  Level of Consciousness: awake and alert   Airway & Oxygen Therapy: Patient Spontanous Breathing  Post-op Assessment: Report given to RN and Post -op Vital signs reviewed and stable  Post vital signs: Reviewed and stable  Last Vitals:  Vitals Value Taken Time  BP 130/74 05/22/24 14:00  Temp 36.1 C 05/22/24 13:59  Pulse 65 05/22/24 14:00  Resp 13 05/22/24 14:00  SpO2 97 % 05/22/24 14:00  Vitals shown include unfiled device data.  Last Pain:  Vitals:   05/22/24 1359  TempSrc:   PainSc: 0-No pain    0      Complications: No notable events documented.

## 2024-05-22 NOTE — Anesthesia Preprocedure Evaluation (Addendum)
 Anesthesia Evaluation  Patient identified by MRN, date of birth, ID band Patient awake    Reviewed: Allergy & Precautions, H&P , NPO status , Patient's Chart, lab work & pertinent test results  Airway Mallampati: II  TM Distance: <3 FB Neck ROM: Full    Dental no notable dental hx. (+) Caps   Pulmonary former smoker   Pulmonary exam normal breath sounds clear to auscultation       Cardiovascular negative cardio ROS Normal cardiovascular exam Rhythm:Regular Rate:Normal     Neuro/Psych negative neurological ROS  negative psych ROS   GI/Hepatic negative GI ROS, Neg liver ROS, hiatal hernia,GERD  ,,  Endo/Other  negative endocrine ROS    Renal/GU Renal diseasenegative Renal ROS  negative genitourinary   Musculoskeletal negative musculoskeletal ROS (+)    Abdominal   Peds negative pediatric ROS (+)  Hematology negative hematology ROS (+)   Anesthesia Other Findings  Understandably anxious, will give versed  1 mg IV for anxiolysis and add as needed Personal history of colonic polyps History of chicken pox Sebaceous cyst  BPPV (benign paroxysmal positional vertigo) IBS (irritable bowel syndrome) Chronic cough Chronic kidney disease GERD (gastroesophageal reflux disease) History of hiatal hernia  Hx of squamous cell carcinoma of skin Hyperlipidemia  Vertigo Carotid stenosis  Vitamin D  deficiency Squamous cell carcinoma of skin  Very athletic lady, plays tennis    Reproductive/Obstetrics negative OB ROS                              Anesthesia Physical Anesthesia Plan  ASA: 3  Anesthesia Plan: MAC   Post-op Pain Management:    Induction: Intravenous  PONV Risk Score and Plan:   Airway Management Planned: Natural Airway and Nasal Cannula  Additional Equipment:   Intra-op Plan:   Post-operative Plan:   Informed Consent: I have reviewed the patients History and Physical,  chart, labs and discussed the procedure including the risks, benefits and alternatives for the proposed anesthesia with the patient or authorized representative who has indicated his/her understanding and acceptance.     Dental Advisory Given  Plan Discussed with: Anesthesiologist, CRNA and Surgeon  Anesthesia Plan Comments: (Patient consented for risks of anesthesia including but not limited to:  - adverse reactions to medications - damage to eyes, teeth, lips or other oral mucosa - nerve damage due to positioning  - sore throat or hoarseness - Damage to heart, brain, nerves, lungs, other parts of body or loss of life  Patient voiced understanding and assent.)         Anesthesia Quick Evaluation

## 2024-05-23 ENCOUNTER — Encounter: Payer: Self-pay | Admitting: Ophthalmology

## 2024-05-23 NOTE — Anesthesia Preprocedure Evaluation (Addendum)
 Anesthesia Evaluation  Patient identified by MRN, date of birth, ID band Patient awake    Reviewed: Allergy & Precautions, H&P , NPO status , Patient's Chart, lab work & pertinent test results  Airway Mallampati: II  TM Distance: <3 FB     Dental  (+) Caps   Pulmonary former smoker          Cardiovascular negative cardio ROS      Neuro/Psych negative neurological ROS  negative psych ROS   GI/Hepatic negative GI ROS, Neg liver ROS, hiatal hernia,GERD  ,,  Endo/Other  negative endocrine ROS    Renal/GU Renal diseasenegative Renal ROS  negative genitourinary   Musculoskeletal negative musculoskeletal ROS (+)    Abdominal   Peds negative pediatric ROS (+)  Hematology negative hematology ROS (+)   Anesthesia Other Findings Previous cataract surgery 05-22-24 Dr. Ola  Personal history of colonic polyps History of chicken pox Sebaceous cyst  BPPV (benign paroxysmal positional vertigo) IBS (irritable bowel syndrome) Chronic cough Chronic kidney disease GERD (gastroesophageal reflux disease) History of hiatal hernia  Hx of squamous cell carcinoma of skin Hyperlipidemia Vertigo Carotid stenosis Vitamin D  deficiency Squamous cell carcinoma of skin  Patient is extremely anxious. States last time she does not remember anything and she hopes it will be the same this time. I hope so, too, but explained to patient that many people remember more the second surgery. The eye will still be numb, but she may require more med for amnestic effect. She vomited at home last time, but states, I'd rather vomit and make sure my eye is OK.  Discussed and assured her we will administer meds for surgeon to be able to work, and she may or may not become nauseated, but will plan to try to avoid nausea, while providing adequate sedation, but she may have recall or nausea/vomiting.    Reproductive/Obstetrics negative OB ROS                               Anesthesia Physical Anesthesia Plan  ASA: 3  Anesthesia Plan: MAC   Post-op Pain Management:    Induction: Intravenous  PONV Risk Score and Plan:   Airway Management Planned: Natural Airway and Nasal Cannula  Additional Equipment:   Intra-op Plan:   Post-operative Plan:   Informed Consent: I have reviewed the patients History and Physical, chart, labs and discussed the procedure including the risks, benefits and alternatives for the proposed anesthesia with the patient or authorized representative who has indicated his/her understanding and acceptance.     Dental Advisory Given  Plan Discussed with: Anesthesiologist, CRNA and Surgeon  Anesthesia Plan Comments: (Patient consented for risks of anesthesia including but not limited to:  - adverse reactions to medications - damage to eyes, teeth, lips or other oral mucosa - nerve damage due to positioning  - sore throat or hoarseness - Damage to heart, brain, nerves, lungs, other parts of body or loss of life  Patient voiced understanding and assent.)         Anesthesia Quick Evaluation

## 2024-05-25 NOTE — Discharge Instructions (Signed)

## 2024-05-29 ENCOUNTER — Encounter
Admission: RE | Disposition: A | Discharge status: Home / Self Care | Payer: Self-pay | Source: Home / Self Care | Attending: Ophthalmology

## 2024-05-29 ENCOUNTER — Encounter: Payer: Self-pay | Admitting: Ophthalmology

## 2024-05-29 ENCOUNTER — Other Ambulatory Visit: Payer: Self-pay

## 2024-05-29 ENCOUNTER — Ambulatory Visit: Payer: Self-pay | Admitting: Anesthesiology

## 2024-05-29 ENCOUNTER — Ambulatory Visit
Admission: RE | Admit: 2024-05-29 | Discharge: 2024-05-29 | Disposition: A | Discharge status: Home / Self Care | Attending: Ophthalmology | Admitting: Ophthalmology

## 2024-05-29 DIAGNOSIS — H2511 Age-related nuclear cataract, right eye: Secondary | ICD-10-CM | POA: Diagnosis present

## 2024-05-29 DIAGNOSIS — N289 Disorder of kidney and ureter, unspecified: Secondary | ICD-10-CM | POA: Insufficient documentation

## 2024-05-29 DIAGNOSIS — Z87891 Personal history of nicotine dependence: Secondary | ICD-10-CM | POA: Insufficient documentation

## 2024-05-29 DIAGNOSIS — K449 Diaphragmatic hernia without obstruction or gangrene: Secondary | ICD-10-CM | POA: Insufficient documentation

## 2024-05-29 DIAGNOSIS — K219 Gastro-esophageal reflux disease without esophagitis: Secondary | ICD-10-CM | POA: Diagnosis not present

## 2024-05-29 HISTORY — PX: CATARACT EXTRACTION W/PHACO: SHX586

## 2024-05-29 SURGERY — PHACOEMULSIFICATION, CATARACT, WITH IOL INSERTION
Anesthesia: Monitor Anesthesia Care | Laterality: Right

## 2024-05-29 MED ORDER — LACTATED RINGERS IV SOLN: INTRAVENOUS | Status: DC

## 2024-05-29 MED ORDER — SIGHTPATH DOSE#1 NA HYALUR & NA CHOND-NA HYALUR IO KIT
PACK | INTRAOCULAR | Status: DC | PRN
  Administered 2024-05-29: 1 via OPHTHALMIC

## 2024-05-29 MED ORDER — MOXIFLOXACIN HCL 0.5 % OP SOLN
OPHTHALMIC | Status: DC | PRN
  Administered 2024-05-29: .2 mL via OPHTHALMIC

## 2024-05-29 MED ORDER — MIDAZOLAM HCL 2 MG/2ML IJ SOLN
INTRAMUSCULAR | Status: DC | PRN
  Administered 2024-05-29: .5 mg via INTRAVENOUS
  Administered 2024-05-29: 1 mg via INTRAVENOUS
  Administered 2024-05-29: .5 mg via INTRAVENOUS

## 2024-05-29 MED ORDER — MIDAZOLAM HCL 2 MG/2ML IJ SOLN
INTRAMUSCULAR | Status: AC
  Filled 2024-05-29: qty 2

## 2024-05-29 MED ORDER — TETRACAINE HCL 0.5 % OP SOLN
1.0000 [drp] | OPHTHALMIC | Status: DC | PRN
  Administered 2024-05-29 (×3): 1 [drp] via OPHTHALMIC

## 2024-05-29 MED ORDER — DEXMEDETOMIDINE HCL IN NACL 80 MCG/20ML IV SOLN
INTRAVENOUS | Status: AC
  Filled 2024-05-29: qty 20

## 2024-05-29 MED ORDER — SIGHTPATH DOSE#1 BSS IO SOLN
INTRAOCULAR | Status: DC | PRN
  Administered 2024-05-29: 102 mL via OPHTHALMIC

## 2024-05-29 MED ORDER — LIDOCAINE HCL (PF) 2 % IJ SOLN
INTRAOCULAR | Status: DC | PRN
  Administered 2024-05-29: 1 mL via INTRAOCULAR

## 2024-05-29 MED ORDER — DEXMEDETOMIDINE HCL IN NACL 200 MCG/50ML IV SOLN
INTRAVENOUS | Status: DC | PRN
  Administered 2024-05-29 (×5): 4 ug via INTRAVENOUS

## 2024-05-29 MED ORDER — ONDANSETRON HCL 4 MG/2ML IJ SOLN
4.0000 mg | Freq: Once | INTRAMUSCULAR | Status: AC
  Administered 2024-05-29: 4 mg via INTRAVENOUS

## 2024-05-29 MED ORDER — SIGHTPATH DOSE#1 BSS IO SOLN
INTRAOCULAR | Status: DC | PRN
  Administered 2024-05-29: 15 mL via INTRAOCULAR

## 2024-05-29 MED ORDER — ARMC OPHTHALMIC DILATING DROPS
1.0000 | OPHTHALMIC | Status: DC | PRN
  Administered 2024-05-29 (×3): 1 via OPHTHALMIC

## 2024-05-29 MED ORDER — ARMC OPHTHALMIC DILATING DROPS
OPHTHALMIC | Status: AC
Start: 2024-05-29 — End: 2024-05-29
  Filled 2024-05-29: qty 0.5

## 2024-05-29 MED ORDER — MIDAZOLAM HCL 2 MG/2ML IJ SOLN
1.0000 mg | Freq: Once | INTRAMUSCULAR | Status: AC
Start: 2024-05-29 — End: 2024-05-29
  Administered 2024-05-29: 1 mg via INTRAVENOUS

## 2024-05-29 MED ORDER — ONDANSETRON HCL 4 MG/2ML IJ SOLN
INTRAMUSCULAR | Status: AC
  Filled 2024-05-29: qty 2

## 2024-05-29 MED ORDER — TETRACAINE HCL 0.5 % OP SOLN
OPHTHALMIC | Status: AC
  Filled 2024-05-29: qty 4

## 2024-05-29 SURGICAL SUPPLY — 10 items
CATARACT SUITE SIGHTPATH (MISCELLANEOUS) ×1 IMPLANT
DISSECTOR HYDRO NUCLEUS 50X22 (MISCELLANEOUS) ×1 IMPLANT
FEE CATARACT SUITE SIGHTPATH (MISCELLANEOUS) ×1 IMPLANT
GLOVE PI ULTRA LF STRL 7.5 (GLOVE) ×1 IMPLANT
GLOVE SURG SYN 6.5 PF PI BL (GLOVE) ×1 IMPLANT
GLOVE SURG SYN 8.5 PF PI BL (GLOVE) ×1 IMPLANT
LENS IOL CLRN PAN 6 23.5 IMPLANT
NDL FILTER BLUNT 18X1 1/2 (NEEDLE) ×1 IMPLANT
NEEDLE FILTER BLUNT 18X1 1/2 (NEEDLE) ×1 IMPLANT
SYR 3ML LL SCALE MARK (SYRINGE) ×1 IMPLANT

## 2024-05-29 NOTE — Op Note (Signed)
 OPERATIVE NOTE  Kathleen Fitzgerald [id number] 05/29/2024   PREOPERATIVE DIAGNOSIS:  Nuclear sclerotic cataract right eye.  H25.11   POSTOPERATIVE DIAGNOSIS:    Nuclear sclerotic cataract right eye.     PROCEDURE:  Phacoemusification with posterior chamber intraocular lens placement of the right eye   LENS:   Implant Name Type Inv. Item Serial No. Manufacturer Lot No. LRB No. Used Action  LENS IOL CLRN PAN 6 23.5 - [id number]  LENS IOL CLRN PAN 6 23.5 [id number] [employer]  Right 1 Implanted       Procedure(s): PHACOEMULSIFICATION, CATARACT, WITH IOL INSERTION 8.35, 00:48.6 (Right)  SURGEON:  Adine Novak, MD, MPH  ANESTHESIOLOGIST: Anesthesiologist: Ola Donny BROCKS, MD CRNA: Jahoo, Sonia, CRNA   ANESTHESIA:  Topical with tetracaine  drops augmented with 1% preservative-free intracameral lidocaine .  ESTIMATED BLOOD LOSS: less than 1 mL.   COMPLICATIONS:  None.   DESCRIPTION OF PROCEDURE:  The patient was identified in the holding room and transported to the operating room and placed in the supine position under the operating microscope.  The right eye was identified as the operative eye and it was prepped and draped in the usual sterile ophthalmic fashion.   A 1.0 millimeter clear-corneal paracentesis was made at the 10:30 position. 0.5 ml of preservative-free 1% lidocaine  with epinephrine  was injected into the anterior chamber.  The anterior chamber was filled with viscoelastic.  A 2.4 millimeter keratome was used to make a near-clear corneal incision at the 8:00 position.  A curvilinear capsulorrhexis was made with a cystotome and capsulorrhexis forceps.  Balanced salt solution was used to hydrodissect and hydrodelineate the nucleus.   Phacoemulsification was then used in stop and chop fashion to remove the lens nucleus and epinucleus.  The remaining cortex was then removed using the irrigation and aspiration handpiece. Viscoelastic was then placed into the capsular bag to distend it  for lens placement.  A lens was then injected into the capsular bag.  The remaining viscoelastic was aspirated.   Wounds were hydrated with balanced salt solution.  The anterior chamber was inflated to a physiologic pressure with balanced salt solution.   Intracameral vigamox  0.1 mL undiluted was injected into the eye and a drop placed onto the ocular surface.  No wound leaks were noted.  The patient was taken to the recovery room in stable condition without complications of anesthesia or surgery  Adine Novak 05/29/2024, 10:09 AM

## 2024-05-29 NOTE — Anesthesia Postprocedure Evaluation (Signed)
 Anesthesia Post Note  Patient: Kathleen Fitzgerald  Procedure(s) Performed: PHACOEMULSIFICATION, CATARACT, WITH IOL INSERTION 8.35, 00:48.6 (Right)  Patient location during evaluation: PACU Anesthesia Type: MAC Level of consciousness: awake and alert Pain management: pain level controlled Vital Signs Assessment: post-procedure vital signs reviewed and stable Respiratory status: spontaneous breathing, nonlabored ventilation, respiratory function stable and patient connected to nasal cannula oxygen Cardiovascular status: stable and blood pressure returned to baseline Postop Assessment: no apparent nausea or vomiting Anesthetic complications: no   No notable events documented.   Last Vitals:  Vitals:   05/29/24 1015 05/29/24 1018  BP: (!) 89/52 (!) 94/57  Pulse: (!) 57 (!) 56  Resp: 14 17  Temp:  (!) 36.4 C  SpO2: 94% 96%    Last Pain:  Vitals:   05/29/24 1018  TempSrc:   PainSc: 0-No pain                 Latorie Montesano C Tristram Milian

## 2024-05-29 NOTE — H&P (Signed)
 [facility]   Primary Care Physician:  Epifanio Alm SQUIBB, MD Ophthalmologist: Dr. Adine Novak  Pre-Procedure History & Physical: HPI:  Kathleen Fitzgerald is a 83 y.o. female here for cataract surgery.   Past Medical History:  Diagnosis Date   BPPV (benign paroxysmal positional vertigo)    2 episodes   Carotid stenosis    Chronic cough 2004   Has been evaluated with no etiology   Chronic kidney disease    KIDNEY STONES   GERD (gastroesophageal reflux disease)    History of chicken pox    childhood   History of hiatal hernia    Hx of squamous cell carcinoma of skin 09/19/2018   L upper arm   Hyperlipidemia    IBS (irritable bowel syndrome)    Personal history of colonic polyps    Colonoscopy 04/12/13 - Repeat 5 years - polys   Sebaceous cyst 1975 and 2006   Scalp   Squamous cell carcinoma of skin    L upper arm   Vertigo    Vitamin D  deficiency     Past Surgical History:  Procedure Laterality Date   BREAST SURGERY Left 2000   biopsy - benign   bunion repair Right 12/1988   CATARACT EXTRACTION W/PHACO Left 05/22/2024   Procedure: PHACOEMULSIFICATION, CATARACT, WITH IOL INSERTION 6.76 00:45.8;  Surgeon: Novak Adine Anes, MD;  Location: [facility];  Service: Ophthalmology;  Laterality: Left;   COLONOSCOPY     COLONOSCOPY WITH PROPOFOL  N/A 04/24/2019   Procedure: COLONOSCOPY WITH PROPOFOL ;  Surgeon: Viktoria Lamar DASEN, MD;  Location: [facility];  Service: Endoscopy;  Laterality: N/A;   CYST EXCISION     HEAD AND LEGS   ESOPHAGOGASTRODUODENOSCOPY (EGD) WITH PROPOFOL  N/A 04/24/2019   Procedure: ESOPHAGOGASTRODUODENOSCOPY (EGD) WITH PROPOFOL ;  Surgeon: Viktoria Lamar DASEN, MD;  Location[facility];  Service: Endoscopy;  Laterality: N/A;   HALLUX VALGUS AUSTIN Right 04/12/2015   Procedure: Right hallux valgus correction, aiken osteotomy ;  Surgeon: Donnice Cory, DPM;  Location: [facility] ORS;  Service: Podiatry;  Laterality: Right;  excisions exostosis 2nd toe    SUBMANDIBULAR GLAND EXCISION Left 10/17/2019   Procedure: EXCISION SUBMANDIBULAR GLAND;  Surgeon: Herminio Miu, MD;  Location: [facility] ORS;  Service: ENT;  Laterality: Left;    Prior to Admission medications   Medication Sig Start Date End Date Taking? Authorizing Provider  aspirin EC 81 MG tablet Take 81 mg by mouth daily. Swallow whole.   Yes [provider]  Calcium Carb-Cholecalciferol (CALCIUM 600+D3 PO) Take 1 tablet by mouth 2 (two) times daily.   Yes [provider]  Cholecalciferol (VITAMIN D3) 50 MCG (2000 UT) TABS Take 2,000 Units by mouth daily with lunch.   Yes [provider]  FIBER ADULT GUMMIES PO Take 2 tablets by mouth daily. Fiber Well gummies   Yes [provider]  Multiple Vitamin (MULTIVITAMIN WITH MINERALS) TABS tablet Take 1 tablet by mouth daily.   Yes [provider]  Alpha-Lipoic Acid 600 MG CAPS Take by mouth. Patient not taking: Reported on 05/01/2024    [provider]    Allergies as of 05/01/2024 - Review Complete 05/01/2024  Allergen Reaction Noted   Donnatal [pb-hyoscy-atropine-scopolamine] Other (See Comments) 04/11/2015   Oxycodone Other (See Comments) 05/21/2015    Family History  Problem Relation Age of Onset   Breast cancer Mother    Hypertension Mother    Colon cancer Father    Alcohol abuse Father    Stroke Father  Breast cancer Sister    COPD Sister     Social History   Socioeconomic History   Marital status: Single    Spouse name: Not on file   Number of children: 0   Years of education: 13   Highest education level: Not on file  Occupational History   Occupation: Investment banker, corporate Work    Comment: Retired  Tobacco Use   Smoking status: Former    Current packs/day: 0.00    Average packs/day: 2.0 packs/day for 33.0 years (66.0 ttl pk-yrs)    Types: Cigarettes    Start date: 11/02/1958    Quit date: 11/03/1991    Years since quitting: 32.5   Smokeless tobacco: Never  Vaping  Use   Vaping status: Never Used  Substance and Sexual Activity   Alcohol use: Yes    Comment: wine - rare   Drug use: No   Sexual activity: Not Currently  Other Topics Concern   Not on file  Social History Narrative   Luise grew up in [place] . She was living in [place] for 1 year. She recently moved to the [place] area. She lives alone. Never married. No children. She has two cats [facility] & [place]). She enjoys gardening.       Exercise - not at present   Caffeine - 1 cup of coffee daily   Diet - follows healthy diet - fruits, vegetables, lean protein, Almond milk, water   Social Drivers of Corporate investment banker Strain: Low Risk  (12/27/2023)   Received from [facility]   Overall Financial Resource Strain (CARDIA)    Difficulty of Paying Living Expenses: Not hard at all  Food Insecurity: No Food Insecurity (12/27/2023)   Received from [facility]   Hunger Vital Sign    Within the past 12 months, you worried that your food would run out before you got the money to buy more.: Never true    Within the past 12 months, the food you bought just didn't last and you didn't have money to get more.: Never true  Transportation Needs: No Transportation Needs (12/27/2023)   Received from [facility] - Transportation    In the past 12 months, has lack of transportation kept you from medical appointments or from getting medications?: No    Lack of Transportation (Non-Medical): No  Physical Activity: Sufficiently Active (08/16/2017)   Received from [facility]   Exercise Vital Sign    Days of Exercise per Week: 5 days    Minutes of Exercise per Session: 60 min  Stress: No Stress Concern Present (08/16/2017)   Received from [facility] - Occupational Stress Questionnaire    Feeling of Stress : Only a little  Social Connections: Moderately Integrated  (08/16/2017)   Received from [facility]   Social Connection and Isolation Panel    Frequency of Communication with Friends and Family: Three times a week    Frequency of Social Gatherings with Friends and Family: Three times a week    Attends Religious Services: 1 to 4 times per year    Active Member of Clubs or Organizations: Yes    Attends Engineer, structural: More than 4 times per year    Marital Status: Never married  Intimate Partner Violence: Not on file    Review of Systems: See HPI, otherwise negative ROS  Physical Exam: BP ROLLEN)  148/71   Pulse 63   Temp (!) 97.4 F (36.3 C) (Temporal)   Resp (!) 9   Ht 5' 2.01 (1.575 m)   Wt 54.4 kg   SpO2 97%   BMI 21.92 kg/m  General:   Alert, cooperative. Head:  Normocephalic and atraumatic. Respiratory:  Normal work of breathing. Cardiovascular:  NAD  Impression/Plan: Kathleen Fitzgerald is here for cataract surgery.  Risks, benefits, limitations, and alternatives regarding cataract surgery have been reviewed with the patient.  Questions have been answered.  All parties agreeable.   Adine Novak, MD  05/29/2024, 9:31 AM

## 2024-05-29 NOTE — Transfer of Care (Signed)
 Immediate Anesthesia Transfer of Care Note  Patient: Kathleen Fitzgerald  Procedure(s) Performed: PHACOEMULSIFICATION, CATARACT, WITH IOL INSERTION 8.35, 00:48.6 (Right)  Patient Location: PACU  Anesthesia Type: MAC  Level of Consciousness: awake, alert  and patient cooperative  Airway and Oxygen Therapy: Patient Spontanous Breathing and Patient connected to supplemental oxygen  Post-op Assessment: Post-op Vital signs reviewed, Patient's Cardiovascular Status Stable, Respiratory Function Stable, Patent Airway and No signs of Nausea or vomiting  Post-op Vital Signs: Reviewed and stable  Complications: No notable events documented.

## 2024-08-28 ENCOUNTER — Ambulatory Visit

## 2024-08-28 DIAGNOSIS — L570 Actinic keratosis: Secondary | ICD-10-CM | POA: Diagnosis not present

## 2024-08-28 DIAGNOSIS — L820 Inflamed seborrheic keratosis: Secondary | ICD-10-CM | POA: Diagnosis not present

## 2024-08-28 DIAGNOSIS — D229 Melanocytic nevi, unspecified: Secondary | ICD-10-CM

## 2024-08-28 DIAGNOSIS — L821 Other seborrheic keratosis: Secondary | ICD-10-CM

## 2024-08-28 DIAGNOSIS — L814 Other melanin hyperpigmentation: Secondary | ICD-10-CM | POA: Diagnosis not present

## 2024-08-28 DIAGNOSIS — D1801 Hemangioma of skin and subcutaneous tissue: Secondary | ICD-10-CM

## 2024-08-28 DIAGNOSIS — L280 Lichen simplex chronicus: Secondary | ICD-10-CM

## 2024-08-28 DIAGNOSIS — Z1283 Encounter for screening for malignant neoplasm of skin: Secondary | ICD-10-CM

## 2024-08-28 DIAGNOSIS — L578 Other skin changes due to chronic exposure to nonionizing radiation: Secondary | ICD-10-CM

## 2024-08-28 DIAGNOSIS — Z85828 Personal history of other malignant neoplasm of skin: Secondary | ICD-10-CM

## 2024-08-28 NOTE — Patient Instructions (Signed)
 Cryosurgery  Cryosurgery ("freezing") uses liquid nitrogen to destroy certain types of skin lesions. Lowering the temperature of the lesion in a small area surrounding skin destroys the lesion. Immediately following cryosurgery, you will notice redness and swelling of the treatment area. Blistering or weeping may occur, lasting approximately one week which will then be followed by crusting. Most areas will heal completely in 10 to 14 days.  Wash the treated areas daily. Allow soap and water  to run over the areas, but do not scrub. Should a scab or crust form, allow it to fall off on its own. Do not remove or pick at it. Application of an ointment  and a bandage may make you feel more comfortable, but it is not necessary. Some people develop an allergy to Neosporin, so we recommend that Vaseline or  Aquaphor be used.  The cryotherapy site will be more sensitive than your surrounding skin. Keep it covered, and remember to apply sunscreen every day to all your sun exposed skin. A scar may remain which is lighter or pinker than your normal skin. Your body will continue to improve your scar for up to one year; however a light-colored scar may remain.  Infection following cryotherapy is rare. However if you are worried about the appearance of the treated area, contact your doctor. We have a physician on call at all times. If you have any concerns about the site, please call our clinic at [phone]    Skin Care and Sun Protection  Your skin plays an important role in keeping the entire body healthy. Below are some tips on how to try and maximize skin health from the outside in.  Bathing  Bathe in mildly warm water  every 1 to 2 days, followed by light drying and an application of a thick moisturizer cream or ointment, preferably one that comes in a tub.  Recommended body soaps/washes: - Cerave Hydrating Cleanser Bar - Dove Sensitive Skin Fragrance Free Beauty Bar - Aveeno Active Naturals Skin  Relief Body Wash, Fragrance Free - Free & Clear (vanicream) liquid cleanser  Moisturizer  Body moisturizer: Apply a moisturizer throughout the day and after bathing.  When you moisturize after bathing, this locks in the moisture.  This can lead to softer and smoother skin.  Body moisturizers come in ointments, creams, and lotions.  If you have dry skin, we recommend the use of ointments or creams rather than lotions.  In other words, something you scoop out of a jar rather than squirted out.  Ointments and creams are thicker and thus provide better moisturization.    Recommended creams for all over: - Vanicream cream - CeraVe Moisturizing Cream - Eucerin Original Healing Soothing Repair Cream  Recommended ointments: greasy, but do the best job at moisturization - Plain Vaseline (petroleum jelly) - CeraVe Healing ointment - Aquaphor Healing ointment  Face moisturizers: For your face, look for something that is labeled as non-comedogenic (won't clog pores) and oil-free. Your moisturizer for the day should have SPF 30 or higher in it as well, but your moisturizer for night can be without SPF. Some good examples are: - CeraVe Moisturizing Cream (can be used as a face moisturizer) - La Roche-Posay Toleriane Double Repair Facial Moisturizer with SPF 30 (my favorite for day time) - CeraVe AM (has SPF 30) - CeraVe PM  Sunscreen  Who needs sunscreen? Everyone. Sunscreen use can help prevent skin cancer by protecting you from the sun's harmful ultraviolet rays. Anyone can get skin cancer, regardless of age,  gender or race. In fact, it is estimated that one in five Americans will develop skin cancer in their lifetime.  Sunscreen alone cannot fully protect you. In addition to wearing sunscreen, dermatologists recommend taking the following steps to protect your skin and find skin cancer early:  Seek shade when appropriate, remembering that the sun's rays are strongest between 10 a.m. and 2 p.m. If  your shadow is shorter than you are, seek shade. Dress to protect yourself from the sun by wearing a lightweight long-sleeved shirt, pants, a wide-brimmed hat and sunglasses, when possible.  Use extra caution near water , snow and sand as they reflect the damaging rays of the sun, which can increase your chance of sunburn.  Get vitamin D  safely through a healthy diet that may include vitamin supplements. Don't seek the sun. Avoid tanning beds. Ultraviolet light from the sun and tanning beds can cause skin cancer and wrinkling. If you want to look tan, you may wish to use a self-tanning product, but continue to use sunscreen with it.  When should I use sunscreen? Every day you go outside--even if you're just walking to and from your form of transportation. The sun emits harmful UV rays year-round. Even on cloudy days, up to 80 percent of the sun's harmful UV rays can penetrate your skin. Snow, sand and water  increase the need for sunscreen because they reflect the sun's rays.  How much sunscreen should I use, and how often should I apply it? Most people only apply 25-50 percent of the recommended amount of sunscreen. Apply enough sunscreen to cover all exposed skin. Most adults need about 1 ounce -- or enough to fill a shot glass -- to fully cover their body.  Don't forget to apply to the tops of your feet, your neck, your ears and the top of your head. Apply sunscreen to dry skin 15 minutes before going outdoors.  Skin cancer also can form on the lips. To protect your lips, apply a lip balm or lipstick that contains sunscreen with an SPF of 30 or higher.  When outdoors, reapply sunscreen approximately every two hours, or after swimming or sweating, according to the directions on the bottle.   Broad-spectrum sunscreens protect against both UVA and UVB rays. What is the difference between the rays? Sunlight consists of two types of harmful rays that reach the earth -- UVA rays and UVB rays. Overexposure  to either can lead to skin cancer. In addition to causing skin cancer, here's what each of these rays do:  UVA rays (or aging rays) can prematurely age your skin, causing wrinkles and age spots, and can pass through window glass. UVB rays (or burning rays) are the primary cause of sunburn and are blocked by window glass  There is no safe way to tan. Every time you tan, you damage your skin. As this damage builds, you speed up the aging of your skin and increase your risk for all types of skin cancer.  What is the difference between chemical and physical sunscreens? Chemical sunscreens work like a sponge, absorbing the sun's rays. They contain one or more of the following active ingredients: oxybenzone, avobenzone, octisalate, octocrylene, homosalate and octinoxate. These formulations tend to be easier to rub into the skin without leaving a white residue.   Physical sunscreens work like a shield, sitting sit on the surface of your skin and deflecting the sun's rays. They contain the active ingredients zinc oxide and/or titanium dioxide. Use this sunscreen if you have  sensitive skin.   What type of sunscreen should I use? The best type of sunscreen is the one you will use again and again. Just make sure it offers broad-spectrum (UVA and UVB) protection, has an SPF of 30+, and is water -resistant. The kind of sunscreen you use is a matter of personal choice, and may vary depending on the area of the body to be protected. Available sunscreen options include lotions, creams, gels, ointments, wax sticks and sprays.  Recommended physical sunscreens for face: - Neutrogena Sheer Zinc - Aveeno Positively Mineral Sensitive - CeraVe Hydrating Mineral (also has a tinted version) - La Roche-Posay Anthelios Mineral Face (comes as a cream, lotion, light fluid, and there is also a tinted version).  - EltaMD UV Clear (also has a tinted version)  Recommended physical sunscreens for body: - Neutrogena Sheer Zinc  Dry-Touch Sunscreen Sensitive Skin Lotion Broad Spectrum SPF 50 - Aveeno Positively Mineral Sensitive Skin Sunscreen Broad Spectrum SPF 50 - La Roche-Posay Anthelios SPF 50 Mineral Sunscreen - Gentle Lotion - CeraVe Hydrating Mineral Sunscreen SPF 50  Recommended chemical sunscreens for face: - Anthelios UV Correct Face Sunscreen SPF 70 with Niacinamide - Neutrogena Clear Face Oil-Free SPF 50 with Helioplex - Neutrogena Sport Face Oil-Free SPF 70+ with Helioplex - Aveeno Protect + Hydrate Sunscreen For Face SPF 70 - La Roche-Posay Anthelios Light Fluid Sunscreen for Face SPF 60  Recommended chemical sunscreens for body: - Neutrogena Ultra Sheer Dry-Touch Sunscreen SPF 70 - Aveeno Protect + Hydrate Broad Spectrum All-Day Hydration SPF 60 (comes in a big pump) - La Roche-Posay Anthelios Melt-In Milk Sunscreen SPF 60  Recommended UPF Clothing - Coolibar  - Solbari  - Wallaroo hats  - Materials engineer (On [employer])     Due to recent changes in healthcare laws, you may see results of your pathology and/or laboratory studies on MyChart before the doctors have had a chance to review them. We understand that in some cases there may be results that are confusing or concerning to you. Please understand that not all results are received at the same time and often the doctors may need to interpret multiple results in order to provide you with the best plan of care or course of treatment. Therefore, we ask that you please give us  2 business days to thoroughly review all your results before contacting the office for clarification. Should we see a critical lab result, you will be contacted sooner.   If You Need Anything After Your Visit  If you have any questions or concerns for your doctor, please call our main line at [phone] and press option 4 to reach your doctor's medical assistant. If no one answers, please leave a voicemail as directed and we will return your call as soon as possible. Messages left  after 4 pm will be answered the following business day.   You may also send us  a message via MyChart. We typically respond to MyChart messages within 1-2 business days.  For prescription refills, please ask your pharmacy to contact our office. Our fax number is [phone].  If you have an urgent issue when the clinic is closed that cannot wait until the next business day, you can page your doctor at the number below.    Please note that while we do our best to be available for urgent issues outside of office hours, we are not available 24/7.   If you have an urgent issue and are unable to reach us , you may choose to seek medical  care at your doctor's office, retail clinic, urgent care center, or emergency room.  If you have a medical emergency, please immediately call 911 or go to the emergency department.  Pager Numbers  - Dr. Hester: [phone]  - Dr. Jackquline: [phone]  - Dr. Claudene: [phone]   - Dr. Raymund: [phone]  In the event of inclement weather, please call our main line at [phone] for an update on the status of any delays or closures.  Dermatology Medication Tips: Please keep the boxes that topical medications come in in order to help keep track of the instructions about where and how to use these. Pharmacies typically print the medication instructions only on the boxes and not directly on the medication tubes.   If your medication is too expensive, please contact our office at [phone] option 4 or send us  a message through MyChart.   We are unable to tell what your co-pay for medications will be in advance as this is different depending on your insurance coverage. However, we may be able to find a substitute medication at lower cost or fill out paperwork to get insurance to cover a needed medication.   If a prior authorization is required to get your medication covered by your insurance company, please allow us  1-2 business days to complete this  process.  Drug prices often vary depending on where the prescription is filled and some pharmacies may offer cheaper prices.  The website www.goodrx.com contains coupons for medications through different pharmacies. The prices here do not account for what the cost may be with help from insurance (it may be cheaper with your insurance), but the website can give you the price if you did not use any insurance.  - You can print the associated coupon and take it with your prescription to the pharmacy.  - You may also stop by our office during regular business hours and pick up a GoodRx coupon card.  - If you need your prescription sent electronically to a different pharmacy, notify our office through [facility] or by phone at [phone] option 4.     Si Usted Necesita Algo Despus de Su Visita  Tambin puede enviarnos un mensaje a travs de Clinical cytogeneticist. Por lo general respondemos a los mensajes de MyChart en el transcurso de 1 a 2 das hbiles.  Para renovar recetas, por favor pida a su farmacia que se ponga en contacto con nuestra oficina. Randi lakes de fax es [place] [phone].  Si tiene un asunto urgente cuando la clnica est cerrada y que no puede esperar hasta el siguiente da hbil, puede llamar/localizar a su doctor(a) al nmero que aparece a continuacin.   Por favor, tenga en cuenta que aunque hacemos todo lo posible para estar disponibles para asuntos urgentes fuera del horario de [place], no estamos disponibles las 24 horas del da, los 7 [address]  [address] de la [place].   Si tiene un problema urgente y no puede comunicarse con nosotros, puede optar por buscar atencin mdica  en el consultorio de su doctor(a), en una clnica privada, en un centro de atencin urgente o en una sala de emergencias.  Si tiene Engineer, drilling, por favor llame inmediatamente al 911 o vaya a la sala de emergencias.  Nmeros de bper  - Dr. Hester: [phone]  - Dra. Jackquline: [phone]  - Dr.  Claudene: [phone]  - Dra. Kitts: [phone]  En caso de inclemencias del [place], por favor llame a nuestra lnea principal al [phone] para una actualizacin [employer]  estado de cualquier retraso o cierre.  Consejos para la medicacin en dermatologa: Por favor, guarde las cajas en las que vienen los medicamentos de uso tpico para ayudarle a seguir las instrucciones sobre dnde y cmo usarlos. Las farmacias generalmente imprimen las instrucciones del medicamento slo en las cajas y no directamente en los tubos del [place].   Si su medicamento es muy caro, por favor, pngase en contacto con landry rieger llamando al [phone] y presione la opcin 4 o envenos un mensaje a travs de Clinical cytogeneticist.   No podemos decirle cul ser su copago por los medicamentos por adelantado ya que esto es diferente dependiendo de la cobertura de su seguro. Sin embargo, es posible que podamos encontrar un medicamento sustituto a Audiological scientist un formulario para que el seguro cubra el medicamento que se considera necesario.   Si se requiere una autorizacin previa para que su compaa de seguros malta su medicamento, por favor permtanos de 1 a 2 das hbiles para completar este proceso.  Los precios de los medicamentos varan con frecuencia dependiendo del Environmental consultant de dnde se surte la receta y alguna farmacias pueden ofrecer precios ms baratos.  El sitio web www.goodrx.com tiene cupones para medicamentos de Health and safety inspector. Los precios aqu no tienen en cuenta lo que podra costar con la ayuda del seguro (puede ser ms barato con su seguro), pero el sitio web puede darle el precio si no utiliz Tourist information centre manager.  - Puede imprimir el cupn correspondiente y llevarlo con su receta a la farmacia.  - Tambin puede pasar por nuestra oficina durante el horario de atencin regular y Education officer, museum una tarjeta de cupones de GoodRx.  - Si necesita que su receta se enve electrnicamente a una farmacia diferente,  informe a nuestra oficina a travs de MyChart de [place] o por telfono llamando al [phone] y presione la opcin 4.

## 2024-08-28 NOTE — Progress Notes (Signed)
 Subjective   Kathleen Fitzgerald is a 83 y.o. female who presents for the following: Total body skin exam for skin cancer screening and mole check. The patient has spots, moles and lesions to be evaluated, some may be new or changing and the patient may have concern these could be cancer.. Patient is established patient   Moving back to [place]  soon.   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   Review of Systems:    No other skin or systemic complaints except as noted in HPI or Assessment and Plan.  The following portions of the chart were reviewed this encounter and updated as appropriate: medications, allergies, medical history  Relevant Medical History:  Personal history of non melanoma skin cancer - see medical history for full details   Objective  Well appearing patient in no apparent distress; mood and affect are within normal limits. Examination was performed of the: Full Skin Examination: scalp, head, eyes, ears, nose, lips, neck, chest, axillae, abdomen, back, buttocks, bilateral upper extremities, bilateral lower extremities, hands, feet, fingers, toes, fingernails, and toenails.   Examination notable for: Angioma(s): Scattered red vascular papule(s)  , Lentigo/lentigines: Scattered pigmented macules that are tan to brown in color and are somewhat non-uniform in shape and concentrated in the sun-exposed areas, Seborrheic Keratosis(es): Stuck-on appearing keratotic papule(s) on the trunk, some  irritated with redness, crusting, edema, and/or partial avulsion, Actinic Damage/Elastosis: chronic sun damage: dyspigmentation, telangiectasia, and wrinkling, Actinic keratosis: Scaly erythematous macule(s) concentrated on sun exposed areas   Examination limited by: Undergarments and Patient deferred removal     R upper arm x1, B/L inframammary x7 (8) Erythematous keratotic or waxy stuck-on papule or plaque. Right Ear x4 (4) Erythematous thin papules/macules with gritty scale.   Assessment &  Plan   SKIN CANCER SCREENING PERFORMED TODAY.  BENIGN SKIN FINDINGS  - Lentigines  - Seborrheic keratoses  - Hemangiomas   - Nevus/Multiple Benign Nevi - Reassurance provided regarding the benign appearance of lesions noted on exam today; no treatment is indicated in the absence of symptoms/changes. - Reinforced importance of photoprotective strategies including liberal and frequent sunscreen use of a broad-spectrum SPF 30 or greater, use of protective clothing, and sun avoidance for prevention of cutaneous malignancy and photoaging.  Counseled patient on the importance of regular self-skin monitoring as well as routine clinical skin examinations as scheduled.   ACTINIC DAMAGE - Chronic condition, secondary to cumulative UV/sun exposure - Recommend daily broad spectrum sunscreen SPF 30+ to sun-exposed areas, reapply every 2 hours as needed.  - Staying in the shade or wearing long sleeves, sun glasses (UVA+UVB protection) and wide brim hats (4-inch brim around the entire circumference of the hat) are also recommended for sun protection.  - Call for new or changing lesions.  Personal history of non melanoma skin cancer  - Reviewed medical history for full details  - Reviewed sun protective measures as above - Encouraged full body skin exams     Lichen simplex chronicus. B/L dorsal feet. - explained the etiology and relationship to itch-scratch cycle - recommended that the patient cut their finger nails and refrain from scratching - discussed using Clobetasol Patient deferred treatment at this time.   - General gentle skin care was discussed including: at least twice daily use of a greasy emollient (recommended cerave cream, vanicream, vaseline, aquaphor ointment), taking warm showers once daily lasting 10 minutes or less, using a mild soap (recommended Dove), immediate application of emollient after exiting bath/shower and avoiding skin  care products that contain fragrances   Level of  service outlined above   Procedures, orders, diagnosis for this visit:  INFLAMED SEBORRHEIC KERATOSIS (8) R upper arm x1, B/L inframammary x7 (8) Symptomatic, irritating, patient would like treated. Destruction of lesion - R upper arm x1, B/L inframammary x7 (8) Complexity: simple   Destruction method: cryotherapy   Informed consent: discussed and consent obtained   Timeout:  patient name, date of birth, surgical site, and procedure verified Lesion destroyed using liquid nitrogen: Yes   Region frozen until ice ball extended beyond lesion: Yes   Cryo cycles: 1 or 2. Outcome: patient tolerated procedure well with no complications   Post-procedure details: wound care instructions given   Additional details:  Prior to procedure, discussed risks of blister formation, small wound, skin dyspigmentation, or rare scar following cryotherapy. Recommend Vaseline ointment to treated areas while healing.   AK (ACTINIC KERATOSIS) (4) Right Ear x4 (4) Actinic keratoses are precancerous spots that appear secondary to cumulative UV radiation exposure/sun exposure over time. They are chronic with expected duration over 1 year. A portion of actinic keratoses will progress to squamous cell carcinoma of the skin. It is not possible to reliably predict which spots will progress to skin cancer and so treatment is recommended to prevent development of skin cancer.  Recommend daily broad spectrum sunscreen SPF 30+ to sun-exposed areas, reapply every 2 hours as needed.  Recommend staying in the shade or wearing long sleeves, sun glasses (UVA+UVB protection) and wide brim hats (4-inch brim around the entire circumference of the hat). Call for new or changing lesions. Destruction of lesion - Right Ear x4 (4) Complexity: simple   Destruction method: cryotherapy   Informed consent: discussed and consent obtained   Timeout:  patient name, date of birth, surgical site, and procedure verified Lesion destroyed using  liquid nitrogen: Yes   Region frozen until ice ball extended beyond lesion: Yes   Cryo cycles: 1 or 2. Outcome: patient tolerated procedure well with no complications   Post-procedure details: wound care instructions given   Additional details:  Prior to procedure, discussed risks of blister formation, small wound, skin dyspigmentation, or rare scar following cryotherapy. Recommend Vaseline ointment to treated areas while healing.    Inflamed seborrheic keratosis -     Destruction of lesion  AK (actinic keratosis) -     Destruction of lesion    Return to clinic: Return in about 1 year (around 08/28/2025) for TBSE.  I, Kate Fought, CMA, am acting as scribe for Lauraine JAYSON Kanaris, MD.   Documentation: I have reviewed the above documentation for accuracy and completeness, and I agree with the above.  Lauraine JAYSON Kanaris, MD

## 2024-10-03 ENCOUNTER — Ambulatory Visit: Admitting: Dermatology

## 2024-10-03 ENCOUNTER — Ambulatory Visit: Payer: Medicare Other | Admitting: Dermatology

## 2025-08-29 ENCOUNTER — Ambulatory Visit
